# Patient Record
Sex: Female | Born: 1945 | Race: White | Hispanic: No | Smoking: Never smoker
Health system: Southern US, Community
[De-identification: ages and names within clinical notes are randomized; demographics above are authoritative.]

## PROBLEM LIST (undated history)

## (undated) DIAGNOSIS — T7840XA Allergy, unspecified, initial encounter: Secondary | ICD-10-CM

## (undated) DIAGNOSIS — I10 Essential (primary) hypertension: Secondary | ICD-10-CM

## (undated) DIAGNOSIS — D494 Neoplasm of unspecified behavior of bladder: Secondary | ICD-10-CM

## (undated) DIAGNOSIS — M797 Fibromyalgia: Secondary | ICD-10-CM

## (undated) DIAGNOSIS — C4490 Unspecified malignant neoplasm of skin, unspecified: Secondary | ICD-10-CM

## (undated) DIAGNOSIS — R112 Nausea with vomiting, unspecified: Secondary | ICD-10-CM

## (undated) DIAGNOSIS — N809 Endometriosis, unspecified: Secondary | ICD-10-CM

## (undated) DIAGNOSIS — Z9889 Other specified postprocedural states: Secondary | ICD-10-CM

## (undated) DIAGNOSIS — R251 Tremor, unspecified: Secondary | ICD-10-CM

## (undated) DIAGNOSIS — M1990 Unspecified osteoarthritis, unspecified site: Secondary | ICD-10-CM

## (undated) DIAGNOSIS — M810 Age-related osteoporosis without current pathological fracture: Secondary | ICD-10-CM

## (undated) HISTORY — DX: Fibromyalgia: M79.7

## (undated) HISTORY — DX: Unspecified malignant neoplasm of skin, unspecified: C44.90

## (undated) HISTORY — DX: Tremor, unspecified: R25.1

## (undated) HISTORY — DX: Essential (primary) hypertension: I10

## (undated) HISTORY — PX: STAPEDES SURGERY: SHX789

## (undated) HISTORY — DX: Allergy, unspecified, initial encounter: T78.40XA

## (undated) HISTORY — PX: APPENDECTOMY: SHX54

## (undated) HISTORY — DX: Neoplasm of unspecified behavior of bladder: D49.4

## (undated) HISTORY — DX: Endometriosis, unspecified: N80.9

## (undated) HISTORY — DX: Age-related osteoporosis without current pathological fracture: M81.0

---

## 1979-07-10 HISTORY — PX: TOTAL ABDOMINAL HYSTERECTOMY: SHX209

## 2000-09-19 ENCOUNTER — Encounter: Payer: Self-pay | Admitting: Obstetrics and Gynecology

## 2000-09-19 ENCOUNTER — Encounter: Admission: RE | Admit: 2000-09-19 | Discharge: 2000-09-19 | Payer: Self-pay | Admitting: Obstetrics and Gynecology

## 2002-06-04 ENCOUNTER — Encounter (INDEPENDENT_AMBULATORY_CARE_PROVIDER_SITE_OTHER): Payer: Self-pay | Admitting: Specialist

## 2002-06-04 ENCOUNTER — Ambulatory Visit (HOSPITAL_BASED_OUTPATIENT_CLINIC_OR_DEPARTMENT_OTHER): Admission: RE | Admit: 2002-06-04 | Discharge: 2002-06-05 | Payer: Self-pay | Admitting: Otolaryngology

## 2003-02-25 ENCOUNTER — Encounter: Payer: Self-pay | Admitting: Obstetrics and Gynecology

## 2003-02-25 ENCOUNTER — Encounter: Admission: RE | Admit: 2003-02-25 | Discharge: 2003-02-25 | Payer: Self-pay | Admitting: Obstetrics and Gynecology

## 2004-03-02 ENCOUNTER — Encounter: Admission: RE | Admit: 2004-03-02 | Discharge: 2004-03-02 | Payer: Self-pay | Admitting: Obstetrics and Gynecology

## 2005-03-09 ENCOUNTER — Encounter: Admission: RE | Admit: 2005-03-09 | Discharge: 2005-03-09 | Payer: Self-pay | Admitting: Obstetrics and Gynecology

## 2006-03-22 ENCOUNTER — Encounter: Admission: RE | Admit: 2006-03-22 | Discharge: 2006-03-22 | Payer: Self-pay | Admitting: Obstetrics and Gynecology

## 2006-05-30 ENCOUNTER — Encounter: Admission: RE | Admit: 2006-05-30 | Discharge: 2006-05-30 | Payer: Self-pay | Admitting: Obstetrics & Gynecology

## 2007-04-04 ENCOUNTER — Encounter: Admission: RE | Admit: 2007-04-04 | Discharge: 2007-04-04 | Payer: Self-pay | Admitting: Obstetrics & Gynecology

## 2007-09-12 ENCOUNTER — Ambulatory Visit: Payer: Self-pay | Admitting: Vascular Surgery

## 2007-09-28 ENCOUNTER — Ambulatory Visit: Payer: Self-pay | Admitting: Vascular Surgery

## 2008-04-08 ENCOUNTER — Encounter: Admission: RE | Admit: 2008-04-08 | Discharge: 2008-04-08 | Payer: Self-pay | Admitting: Internal Medicine

## 2008-04-23 ENCOUNTER — Encounter: Admission: RE | Admit: 2008-04-23 | Discharge: 2008-04-23 | Payer: Self-pay | Admitting: Internal Medicine

## 2008-04-29 ENCOUNTER — Other Ambulatory Visit: Admission: RE | Admit: 2008-04-29 | Discharge: 2008-04-29 | Payer: Self-pay | Admitting: Obstetrics and Gynecology

## 2009-03-24 ENCOUNTER — Encounter: Admission: RE | Admit: 2009-03-24 | Discharge: 2009-03-24 | Payer: Self-pay | Admitting: Obstetrics and Gynecology

## 2009-05-19 ENCOUNTER — Encounter: Admission: RE | Admit: 2009-05-19 | Discharge: 2009-05-19 | Payer: Self-pay | Admitting: Internal Medicine

## 2010-05-25 ENCOUNTER — Encounter: Admission: RE | Admit: 2010-05-25 | Discharge: 2010-05-25 | Payer: Self-pay | Admitting: Internal Medicine

## 2011-03-23 NOTE — Consult Note (Signed)
NEW PATIENT CONSULTATION   Natalie, Sutton  DOB:  [birth year]                                       09/12/2007  NUUVO#:[id number]   This patient is a healthy 65 year old female referred through the  courtesy of Mark A. Perini, M.D. for consultation regarding her venous  insufficiency of both lower extremities.  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  These do not cause pain either  aching, burning, cramping or throbbing.  She has no distal swelling,  edema or history of bleeding ulceration, thrombophlebitis, deep venous  thrombosis or pulmonary emboli.  She has not worn elastic compression  stockings and as noted is on her feet most of the day so she is unable  to elevate the legs.   PAST MEDICAL HISTORY:  Negative for diabetes, hypertension, coronary  artery disease, COPD or stroke.   PAST SURGICAL HISTORY:  Includes hysterectomy.   FAMILY HISTORY:  Positive for coronary artery disease and stroke in her  father who is 62 years old, but negative for diabetes.   SOCIAL HISTORY:  She is married, has one child, works as a  Associate Professor.  She does not use tobacco or alcohol.   REVIEW OF SYSTEMS AND MEDICATIONS:  Please see health history form.   ALLERGIES:  ERYTHROMYCIN.   PHYSICAL EXAMINATION:  Blood pressure is 140/80, heart rate is 70,  respirations are 14.  GENERAL:  She is a healthy-appearing middle-aged female.  She is alert  and oriented times three.  NECK:  Supple.  Three plus carotid pulses palpable.  No bruits are  audible.  There is no palpable adenopathy in the neck.  CHEST:  Clear to auscultation.  CARDIOVASCULAR EXAM:  Reveals a regular rhythm with no murmurs.  UPPER EXTREMITIES:  Pulses are intact.  No skin rashes are noted.  ABDOMEN:  Soft and nontender with no  masses.  She has 3+ femoral, popliteal and dorsalis pedis pulses bilaterally.  She has prominent spider and reticular veins in the left lateral thigh  distally at about the knee level and extending down into the very  proximal calf and posteriorly.  She has a few others on her anteromedial  thigh.  She also has a similar distribution on the right leg, but less  prominent.  There is no distal edema or evidence of any venous  hypertension such as hyperpigmentation, ulceration or significant  varicosities.   IMPRESSION:  Spider and reticular veins with no evidence of any  underlying greater saphenous incompetence or venous hypertension.   We will discuss with her possible sclerotherapy and skin laser  treatments for these spider and reticular veins.   Quita Skye Hart Rochester, M.D.  Electronically Signed   JDL/MEDQ  D:  09/12/2007  T:  09/13/2007  Job:  [id number]   cc:   Loraine Leriche A. Perini, M.D.

## 2011-03-26 NOTE — Op Note (Signed)
[name], [name]                         ACCOUNT NO.:  [id number]   MEDICAL RECORD NO.:  [id number]                    PATIENT TYPE:   LOCATION:                                       FACILITY:   PHYSICIAN:  Jefry H. Pollyann Kennedy, M.D.                DATE OF BIRTH:   DATE OF PROCEDURE:  06/04/2002  DATE OF DISCHARGE:                                 OPERATIVE REPORT   PREOPERATIVE DIAGNOSIS:  Bilateral conductive hearing loss, worse on the  left.   POSTOPERATIVE DIAGNOSES:  1. Bilateral conductive hearing loss.  2. Left otosclerosis.   PROCEDURE:  Left stapedectomy.   SURGEON:  Jefry H. Pollyann Kennedy, M.D.   ANESTHESIA:  General endotracheal anesthesia was used.   COMPLICATIONS:  No complications.   FINDINGS:  Clear and healthy middle ear with near complete fixation of the  stapes footplate.  Normal mobility of the malleus and incus.  No evidence of  ossicular degenerative disease.  No evidence of chronic otitis media.  Narrow external auditory canal that only permitted examination with a 5-mm  oral speculum.  The patient tolerated the procedure well, was awakened,  extubated and transferred to recovery in stable condition.   HISTORY:  A 65 year old lady with a history of progressive bilateral hearing  loss.  Office examination and audiometric evaluation revealed bilateral  conductive hearing loss, worse on the left, consistent with otosclerosis.  Risks, benefits, alternatives and complications of the procedure were  explained to the patient, who seemed to understand and agreed to surgery.   DESCRIPTION OF PROCEDURE:  The patient was taken to the operating room and  placed on the operating table in a supine position.  Following induction of  general endotracheal anesthesia, the left ear was prepped and draped in a  standard fashion.  Xylocaine 1% with epinephrine was infiltrated into the  four quadrants to the external auditory canal; a total of approximately 3 cc  were injected.   A posteriorly based tympanomeatal flap was developed and  brought forward, allowing access to the middle ear.  The drum and flap were  kept intact.  The chorda tympani nerve was hugging the superoposterior rim  of the annulus.  A small amount of bone was carefully curetted without  damaging or disrupting the chorda tympani nerve.  There was a small spicule  of bone attached from the promontory to the undersurface of the annulus  posteriorly: this was taken down to assist in visualization.  The ossicular  chain was visualized and palpated.  There was near complete fixation of the  stapes.  A trago-perichondral graft was harvested through a separate  incision.  This incision was closed with a running 5-0 plain gut suture.  The incudostapedial joint was separated with an IS knife.  The stapedial  tendon was severed using a small Beaver blade.  A control hole was created  in the  central portion of  the stapes footplate, which was relatively thin.  The superstructure was down-fractured against the promontory and was  separated from the remainder of the footplate.  Bits of footplate were  removed with an otologic pick and gimmick and some of the periphery were  simply pushed into the vestibule, as I was not able to remove them without  placing the contents of the vestibule in jeopardy.  The graft was placed  onto the oval window.  The anterior tympanic cavity was packed with saline-  soaked Gelfoam.  A Lippy modified Robinson bucket-handle prosthesis, 4 mm in  length, was inserted without difficulty and the bucket handle was flipped  over the incus long process.  There was a good round window reflex.  The  sizing was measured with an ossicular measuring device prior to selecting  the size.  There was good stability of the prosthesis with the graft in  place and nice round window reflex by palpating the incus and the malleus.  There was minimal bleeding encountered.  The flap was brought back to  its  original position and saline-soaked Gelfoam pieces were placed on top of the  canal incision.  A cotton ball with Bacitracin was placed into the external  meatus and the patient was then awakened, extubated and transferred to  recovery.                                               Jefry H. Pollyann Kennedy, M.D.    JHR/MEDQ  D:  06/04/2002  T:  06/08/2002  Job:  [id number]

## 2011-05-17 ENCOUNTER — Other Ambulatory Visit: Payer: Self-pay | Admitting: Internal Medicine

## 2011-05-17 DIAGNOSIS — Z1231 Encounter for screening mammogram for malignant neoplasm of breast: Secondary | ICD-10-CM

## 2011-06-07 ENCOUNTER — Ambulatory Visit
Admission: RE | Admit: 2011-06-07 | Discharge: 2011-06-07 | Disposition: A | Discharge status: Home / Self Care | Payer: Medicare Other | Source: Ambulatory Visit | Attending: Internal Medicine | Admitting: Internal Medicine

## 2011-06-07 DIAGNOSIS — Z1231 Encounter for screening mammogram for malignant neoplasm of breast: Secondary | ICD-10-CM

## 2012-06-13 ENCOUNTER — Other Ambulatory Visit: Payer: Self-pay | Admitting: Internal Medicine

## 2012-06-13 DIAGNOSIS — Z1231 Encounter for screening mammogram for malignant neoplasm of breast: Secondary | ICD-10-CM

## 2012-06-26 ENCOUNTER — Ambulatory Visit: Payer: Medicare Other

## 2012-07-03 ENCOUNTER — Ambulatory Visit
Admission: RE | Admit: 2012-07-03 | Discharge: 2012-07-03 | Disposition: A | Discharge status: Home / Self Care | Payer: Medicare Other | Source: Ambulatory Visit | Attending: Internal Medicine | Admitting: Internal Medicine

## 2012-07-03 DIAGNOSIS — Z1231 Encounter for screening mammogram for malignant neoplasm of breast: Secondary | ICD-10-CM

## 2012-07-06 ENCOUNTER — Other Ambulatory Visit: Payer: Self-pay | Admitting: Internal Medicine

## 2012-07-06 DIAGNOSIS — R928 Other abnormal and inconclusive findings on diagnostic imaging of breast: Secondary | ICD-10-CM

## 2012-07-07 ENCOUNTER — Ambulatory Visit
Admission: RE | Admit: 2012-07-07 | Discharge: 2012-07-07 | Disposition: A | Discharge status: Home / Self Care | Payer: Medicare Other | Source: Ambulatory Visit | Attending: Internal Medicine | Admitting: Internal Medicine

## 2012-07-07 DIAGNOSIS — R928 Other abnormal and inconclusive findings on diagnostic imaging of breast: Secondary | ICD-10-CM

## 2012-11-28 ENCOUNTER — Other Ambulatory Visit: Payer: Self-pay | Admitting: Family Medicine

## 2012-11-28 ENCOUNTER — Other Ambulatory Visit: Payer: Self-pay | Admitting: Internal Medicine

## 2012-11-28 ENCOUNTER — Other Ambulatory Visit: Payer: Self-pay | Admitting: Obstetrics & Gynecology

## 2012-11-28 DIAGNOSIS — R921 Mammographic calcification found on diagnostic imaging of breast: Secondary | ICD-10-CM

## 2012-12-27 ENCOUNTER — Other Ambulatory Visit: Payer: Self-pay | Admitting: Obstetrics & Gynecology

## 2012-12-27 ENCOUNTER — Ambulatory Visit
Admission: RE | Admit: 2012-12-27 | Discharge: 2012-12-27 | Disposition: A | Discharge status: Home / Self Care | Payer: Medicare Other | Source: Ambulatory Visit | Attending: Family Medicine | Admitting: Family Medicine

## 2012-12-27 DIAGNOSIS — R921 Mammographic calcification found on diagnostic imaging of breast: Secondary | ICD-10-CM

## 2013-02-27 ENCOUNTER — Encounter: Payer: Self-pay | Admitting: Internal Medicine

## 2013-04-11 ENCOUNTER — Encounter: Payer: Self-pay | Admitting: Internal Medicine

## 2013-04-11 ENCOUNTER — Ambulatory Visit (AMBULATORY_SURGERY_CENTER): Payer: Medicare Other

## 2013-04-11 VITALS — Ht 65.0 in | Wt 143.4 lb

## 2013-04-11 DIAGNOSIS — Z1211 Encounter for screening for malignant neoplasm of colon: Secondary | ICD-10-CM

## 2013-04-11 MED ORDER — MOVIPREP 100 G PO SOLR: ORAL | Status: DC

## 2013-04-11 NOTE — Progress Notes (Signed)
Pt came into the office for her pre-visit prior to her colonoscopy with Dr Juanda Chance on 04/25/13. Pt states she has a reaction to "egg whites"-causes swelling, but can eat eggs mixed in other foods or scrambled eggs.I left a message for Wilhemina Bonito. ( anesthetist) and did notify the manager of the patient's appointment.

## 2013-04-25 ENCOUNTER — Encounter: Payer: Self-pay | Admitting: Internal Medicine

## 2013-04-25 ENCOUNTER — Ambulatory Visit (AMBULATORY_SURGERY_CENTER): Payer: Medicare Other | Admitting: Internal Medicine

## 2013-04-25 VITALS — BP 110/70 | HR 56 | Temp 97.7°F | Resp 19 | Ht 65.0 in | Wt 143.0 lb

## 2013-04-25 DIAGNOSIS — Z1211 Encounter for screening for malignant neoplasm of colon: Secondary | ICD-10-CM

## 2013-04-25 MED ORDER — SODIUM CHLORIDE 0.9 % IV SOLN: 500.0000 mL | INTRAVENOUS | Status: DC

## 2013-04-25 NOTE — Patient Instructions (Signed)
YOU HAD AN ENDOSCOPIC PROCEDURE TODAY AT THE [facility]: Refer to the procedure report that was given to you for any specific questions about what was found during the examination.  If the procedure report does not answer your questions, please call your gastroenterologist to clarify.  If you requested that your care partner not be given the details of your procedure findings, then the procedure report has been included in a sealed envelope for you to review at your convenience later.  YOU SHOULD EXPECT: Some feelings of bloating in the abdomen. Passage of more gas than usual.  Walking can help get rid of the air that was put into your GI tract during the procedure and reduce the bloating. If you had a lower endoscopy (such as a colonoscopy or flexible sigmoidoscopy) you may notice spotting of blood in your stool or on the toilet paper. If you underwent a bowel prep for your procedure, then you may not have a normal bowel movement for a few days.  DIET: Your first meal following the procedure should be a light meal and then it is ok to progress to your normal diet.  A half-sandwich or bowl of soup is an example of a good first meal.  Heavy or fried foods are harder to digest and may make you feel nauseous or bloated.  Likewise meals heavy in dairy and vegetables can cause extra gas to form and this can also increase the bloating.  Drink plenty of fluids but you should avoid alcoholic beverages for 24 hours.  ACTIVITY: Your care partner should take you home directly after the procedure.  You should plan to take it easy, moving slowly for the rest of the day.  You can resume normal activity the day after the procedure however you should NOT DRIVE or use heavy machinery for 24 hours (because of the sedation medicines used during the test).    SYMPTOMS TO REPORT IMMEDIATELY: A gastroenterologist can be reached at any hour.  During normal business hours, 8:30 AM to 5:00 PM Monday through Friday,  call [phone].  After hours and on weekends, please call the GI answering service at [phone] who will take a message and have the physician on call contact you.   Following lower endoscopy (colonoscopy or flexible sigmoidoscopy):  Excessive amounts of blood in the stool  Significant tenderness or worsening of abdominal pains  Swelling of the abdomen that is new, acute  Fever of 100F or higher    FOLLOW UP: If any biopsies were taken you will be contacted by phone or by letter within the next 1-3 weeks.  Call your gastroenterologist if you have not heard about the biopsies in 3 weeks.  Our staff will call the home number listed on your records the next business day following your procedure to check on you and address any questions or concerns that you may have at that time regarding the information given to you following your procedure. This is a courtesy call and so if there is no answer at the home number and we have not heard from you through the emergency physician on call, we will assume that you have returned to your regular daily activities without incident.  SIGNATURES/CONFIDENTIALITY: You and/or your care partner have signed paperwork which will be entered into your electronic medical record.  These signatures attest to the fact that that the information above on your After Visit Summary has been reviewed and is understood.  Full responsibility of the confidentiality   of this discharge information lies with you and/or your care-partner.     

## 2013-04-25 NOTE — Progress Notes (Signed)
Pt stable to RR 

## 2013-04-25 NOTE — Op Note (Signed)
[facility] [address] [place], [address]   COLONOSCOPY PROCEDURE REPORT  PATIENT: [name], [name]  MR#: [id number] BIRTHDATE: [birth year] , 66  yrs. old GENDER: Female ENDOSCOPIST: Hart Carwin, MD REFERRED BY:  Rodrigo Ran, M.D. PROCEDURE DATE:  04/25/2013 PROCEDURE:   Colonoscopy, screening ASA CLASS:   Class II INDICATIONS:Average risk patient for colon cancer. MEDICATIONS: These medications were titrated to patient response per physician's verbal order, Fentanyl 100 mcg IV, and Versed 6 mg IV   DESCRIPTION OF PROCEDURE:   After the risks and benefits and of the procedure were explained, informed consent was obtained.  A digital rectal exam revealed no abnormalities of the rectum.    The LB [id number]  endoscope was introduced through the anus and advanced to the cecum, which was identified by both the appendix and ileocecal valve .  The quality of the prep was good, using MoviPrep .  The instrument was then slowly withdrawn as the colon was fully examined.     COLON FINDINGS: A normal appearing cecum, ileocecal valve, and appendiceal orifice were identified.  The ascending, hepatic flexure, transverse, splenic flexure, descending, sigmoid colon and rectum appeared unremarkable.  No polyps or cancers were seen. Retroflexed views revealed no abnormalities.     The scope was then withdrawn from the patient and the procedure completed.  COMPLICATIONS: There were no complications. ENDOSCOPIC IMPRESSION: Normal colon  RECOMMENDATIONS: High fiber diet   REPEAT EXAM: In 10 year(s)  for Colonoscopy.  cc:  _______________________________ eSignedHart Carwin, MD 04/25/2013 10:08 AM

## 2013-04-25 NOTE — Progress Notes (Signed)
Patient did not experience any of the following events: a burn prior to discharge; a fall within the facility; wrong site/side/patient/procedure/implant event; or a hospital transfer or hospital admission upon discharge from the facility. (G8907) Patient did not have preoperative order for IV antibiotic SSI prophylaxis. (G8918)  

## 2013-04-26 ENCOUNTER — Telehealth: Payer: Self-pay

## 2013-04-26 NOTE — Telephone Encounter (Signed)
  Follow up Call-  Call back number 04/25/2013  Post procedure Call Back phone  # [phone]  Permission to leave phone message Yes     Patient questions:  Do you have a fever, pain , or abdominal swelling? no Pain Score  0 *  Have you tolerated food without any problems? yes  Have you been able to return to your normal activities? yes  Do you have any questions about your discharge instructions: Diet   no Medications  no Follow up visit  no  Do you have questions or concerns about your Care? no  Actions: * If pain score is 4 or above: No action needed, pain <4.

## 2013-05-24 ENCOUNTER — Encounter: Payer: Self-pay | Admitting: Certified Nurse Midwife

## 2013-05-25 ENCOUNTER — Other Ambulatory Visit: Payer: Self-pay | Admitting: Internal Medicine

## 2013-05-25 ENCOUNTER — Other Ambulatory Visit: Payer: Self-pay | Admitting: Certified Nurse Midwife

## 2013-05-25 DIAGNOSIS — R921 Mammographic calcification found on diagnostic imaging of breast: Secondary | ICD-10-CM

## 2013-05-29 ENCOUNTER — Ambulatory Visit (INDEPENDENT_AMBULATORY_CARE_PROVIDER_SITE_OTHER): Payer: Medicare Other | Admitting: Certified Nurse Midwife

## 2013-05-29 ENCOUNTER — Encounter: Payer: Self-pay | Admitting: Certified Nurse Midwife

## 2013-05-29 VITALS — BP 128/64 | HR 64 | Ht 64.5 in | Wt 143.0 lb

## 2013-05-29 DIAGNOSIS — Z01419 Encounter for gynecological examination (general) (routine) without abnormal findings: Secondary | ICD-10-CM

## 2013-05-29 DIAGNOSIS — N951 Menopausal and female climacteric states: Secondary | ICD-10-CM

## 2013-05-29 MED ORDER — ESTRADIOL 0.025 MG/24HR TD PTTW: 1.0000 | MEDICATED_PATCH | TRANSDERMAL | Status: DC

## 2013-05-29 NOTE — Patient Instructions (Addendum)

## 2013-05-29 NOTE — Progress Notes (Signed)
Patient ID: [name], female   DOB: 03/29/1946, 67 y.o.   MRN: [id number] 67 y.o. G1P1001 MarriedCaucasianF here for annual exam. Menopausal no vaginal bleeding or vaginal dryness. Feels ERT helps with overall well being. See PCP for aex, labs and medication management. Finally had colonoscopy in June 2014 all negative! Tremors have maintained the same, no change. No health issues today.    No LMP recorded. Patient is postmenopausal.          Sexually active: yes  The current method of family planning is status post hysterectomy.    Exercising: yes  treadmill Smoker:  no  Health Maintenance: Pap:  2010, norml History of abnormal Pap:  no MMG:  12/2012, repeat in 06/2013 under follow up with right breast calcifications, has appointment scheduled Colonoscopy:  04/2013 BMD:   2013 osteopenia PCP manages TDaP:  Will get at [facility] Screening Labs: PCP, Hb today: PCP, Urine today: PCP   reports that she has never smoked. She has never used smokeless tobacco. She reports that she does not drink alcohol or use illicit drugs.  Past Medical History  Diagnosis Date  . 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 Allergy   . Fibromyalgia   . Endometriosis     Past Surgical History  Procedure Laterality Date  . Stapedes surgery      left ear  . Appendectomy    . Total abdominal hysterectomy  1980's  . Appendectomy      Current Outpatient Prescriptions  Medication Sig Dispense Refill  . CALCIUM PO Take by mouth daily.      . diclofenac (CATAFLAM) 50 MG tablet Take 50 mg by mouth daily.      Marland Kitchen estradiol (VIVELLE-DOT) 0.025 MG/24HR Place 1 patch onto the skin 2 (two) times a week.      . gabapentin (NEURONTIN) 300 MG capsule Take 300 mg by mouth 3 (three) times daily.      . mometasone (NASONEX) 50 MCG/ACT nasal spray Place 2 sprays into the nose as needed.      . Multiple Vitamin (MULTIVITAMIN) tablet Take 1 tablet by mouth daily.      . Omega-3 Fatty Acids (FISH OIL) 500 MG CAPS  Take by mouth.      . Vitamin D, Ergocalciferol, (DRISDOL) 50000 UNITS CAPS Take 50,000 Units by mouth every 14 (fourteen) days.        No current facility-administered medications for this visit.    Family History  Problem Relation Age of Onset  . Heart disease Father   . Stroke Father   . Hypertension Father   . Alzheimer's disease Mother   . Osteoporosis Mother     ROS:  Pertinent items are noted in HPI.  Otherwise, a comprehensive ROS was negative.  Exam:   BP 128/64  Pulse 64  Ht 5' 4.5" (1.638 m)  Wt 143 lb (64.864 kg)  BMI 24.18 kg/m2 Height: 5' 4.5" (163.8 cm)  Ht Readings from Last 3 Encounters:  05/29/13 5' 4.5" (1.638 m)  04/25/13 5\' 5"  (1.651 m)  04/11/13 5\' 5"  (1.651 m)    General appearance: alert, cooperative and appears stated age Head: Normocephalic, without obvious abnormality, atraumatic Neck: no adenopathy, supple, symmetrical, trachea midline and thyroid normal to inspection and palpation Lungs: clear to auscultation bilaterally Breasts: normal appearance, no masses or tenderness, No nipple retraction or dimpling, No nipple discharge or bleeding, No axillary or supraclavicular adenopathy Heart: regular rate and rhythm Abdomen: soft, non-tender;  bowel sounds normal; no masses,  no organomegaly Extremities: extremities normal, atraumatic, no cyanosis or edema Skin: Skin color, texture, turgor normal. No rashes or lesions Lymph nodes: Cervical, supraclavicular, and axillary nodes normal. No abnormal inguinal nodes palpated Neurologic: Grossly normal   Pelvic: External genitalia:  no lesions              Urethra:  normal appearing urethra with no masses, tenderness or lesions              Bartholins and Skenes: normal                 Vagina: normal appearing vagina with normal color and discharge, no lesions              Cervix: absent              Pap taken: no Bimanual Exam:  Uterus:  uterus absent              Adnexa: normal adnexa and no mass,  fullness, tenderness               Rectovaginal: Confirms               Anus:  normal sphincter tone, no lesions  A:  Well Woman with normal exam  Menopausal on ERT desires continuance  History of Tremors/Fibromyalgia with PCP management  Recent colonoscopy negative  Osteopenia PCP management  P:   Reviewed health and wellness pertinent to exam  Discussed risks and benefits and long term information available. Patient aware of dementia information, but provider and patient feel a continued good choice with Osteopenia history also.  Rx Vivelle dot  See order  Mammogram yearly and keep follow up appointment pap smear not taken today  counseled on breast self exam, mammography screening, use and side effects of HRT, adequate intake of calcium and vitamin D, diet and exercise  return annually or prn  An After Visit Summary was printed and given to the patient.

## 2013-06-01 NOTE — Progress Notes (Signed)
Note reviewed, agree with plan.  Lesly Joslyn, MD  

## 2013-08-17 ENCOUNTER — Ambulatory Visit
Admission: RE | Admit: 2013-08-17 | Discharge: 2013-08-17 | Disposition: A | Discharge status: Home / Self Care | Payer: Medicare Other | Source: Ambulatory Visit | Attending: Certified Nurse Midwife | Admitting: Certified Nurse Midwife

## 2013-08-17 DIAGNOSIS — R921 Mammographic calcification found on diagnostic imaging of breast: Secondary | ICD-10-CM

## 2013-08-20 ENCOUNTER — Encounter (HOSPITAL_BASED_OUTPATIENT_CLINIC_OR_DEPARTMENT_OTHER): Payer: Self-pay | Admitting: Emergency Medicine

## 2013-08-20 ENCOUNTER — Emergency Department (HOSPITAL_BASED_OUTPATIENT_CLINIC_OR_DEPARTMENT_OTHER)
Admission: EM | Admit: 2013-08-20 | Discharge: 2013-08-21 | Disposition: A | Discharge status: Home / Self Care | Payer: Medicare Other | Attending: Emergency Medicine | Admitting: Emergency Medicine

## 2013-08-20 ENCOUNTER — Other Ambulatory Visit: Payer: Self-pay

## 2013-08-20 DIAGNOSIS — Z8742 Personal history of other diseases of the female genital tract: Secondary | ICD-10-CM | POA: Insufficient documentation

## 2013-08-20 DIAGNOSIS — Z79899 Other long term (current) drug therapy: Secondary | ICD-10-CM | POA: Insufficient documentation

## 2013-08-20 DIAGNOSIS — Z880 Allergy status to penicillin: Secondary | ICD-10-CM | POA: Insufficient documentation

## 2013-08-20 DIAGNOSIS — H109 Unspecified conjunctivitis: Secondary | ICD-10-CM

## 2013-08-20 DIAGNOSIS — Z8739 Personal history of other diseases of the musculoskeletal system and connective tissue: Secondary | ICD-10-CM | POA: Insufficient documentation

## 2013-08-20 MED ORDER — TETRACAINE HCL 0.5 % OP SOLN: 2.0000 [drp] | Freq: Once | OPHTHALMIC | Status: DC

## 2013-08-20 MED ORDER — FLUORESCEIN SODIUM 1 MG OP STRP: 1.0000 | ORAL_STRIP | Freq: Once | OPHTHALMIC | Status: DC

## 2013-08-20 MED ORDER — FLUORESCEIN SODIUM 1 MG OP STRP
ORAL_STRIP | OPHTHALMIC | Status: AC
  Filled 2013-08-20: qty 1

## 2013-08-20 MED ORDER — CIPROFLOXACIN HCL 0.3 % OP SOLN
2.0000 [drp] | OPHTHALMIC | Status: DC
  Filled 2013-08-20: qty 2.5

## 2013-08-20 MED ORDER — CIPROFLOXACIN HCL 0.3 % OP SOLN
2.0000 [drp] | OPHTHALMIC | Status: DC
  Administered 2013-08-21: 2 [drp] via OPHTHALMIC

## 2013-08-20 MED ORDER — TETRACAINE HCL 0.5 % OP SOLN
OPHTHALMIC | Status: AC
  Filled 2013-08-20: qty 2

## 2013-08-20 NOTE — ED Notes (Signed)
Pt took benadryl PTA

## 2013-08-20 NOTE — ED Provider Notes (Addendum)
CSN: [id number]     Arrival date & time 08/20/13  2323 History  This chart was scribed for Hanley Seamen, MD by Blanchard Kelch, ED Scribe. The patient was seen in room MH04/MH04. Patient's care was started at 11:49 PM.    Chief Complaint  Patient presents with  . Eye Problem    Patient is a 67 y.o. female presenting with eye problem. The history is provided by the patient. No language interpreter was used.  Eye Problem   HPI Comments: [name] is a 67 y.o. female who presents to the Emergency Department complaining of constant, worsening right eye pain and swelling that began yesterday afternoon when she felt there was a foreign body in her eye. She flushed it out with water without relief. She reports the pain is a 5/10 in severity. She denies nausea, vomiting, diarrhea, cough or sore throat. She is allergic to Amoxicillin. There is associated mucoid discharge. There is associated periorbital erythema and "puffiness".   Past Medical History  Diagnosis Date  . 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 Allergy   . Fibromyalgia   . Endometriosis    Past Surgical History  Procedure Laterality Date  . Stapedes surgery      left ear  . Appendectomy    . Total abdominal hysterectomy  1980's  . Appendectomy     Family History  Problem Relation Age of Onset  . Heart disease Father   . Stroke Father   . Hypertension Father   . Alzheimer's disease Mother   . Osteoporosis Mother    History  Substance Use Topics  . Smoking status: Never Smoker   . Smokeless tobacco: Never Used  . Alcohol Use: No   OB History   Grav Para Term Preterm Abortions TAB SAB Ect Mult Living   1 1 1       1      Review of Systems A complete 10 system review of systems was obtained and all systems are negative except as noted in the HPI and PMH.    Allergies  Amoxicillin and Eggs or egg-derived products  Home Medications   Current Outpatient Rx  Name  Route  Sig  Dispense  Refill  .  CALCIUM PO   Oral   Take by mouth daily.         . diclofenac (CATAFLAM) 50 MG tablet   Oral   Take 50 mg by mouth daily.         Marland Kitchen estradiol (VIVELLE-DOT) 0.025 MG/24HR   Transdermal   Place 1 patch onto the skin 2 (two) times a week.   8 patch   12   . gabapentin (NEURONTIN) 300 MG capsule   Oral   Take 300 mg by mouth 3 (three) times daily.         . mometasone (NASONEX) 50 MCG/ACT nasal spray   Nasal   Place 2 sprays into the nose as needed.         . Multiple Vitamin (MULTIVITAMIN) tablet   Oral   Take 1 tablet by mouth daily.         . Omega-3 Fatty Acids (FISH OIL) 500 MG CAPS   Oral   Take by mouth.         . Vitamin D, Ergocalciferol, (DRISDOL) 50000 UNITS CAPS   Oral   Take 50,000 Units by mouth every 14 (fourteen) days.  Triage Vitals: BP 152/66  Pulse 64  Temp(Src) 98.6 F (37 C) (Oral)  Resp 16  SpO2 100%  Physical Exam  Nursing note and vitals reviewed. General: Well-developed, well-nourished female in no acute distress; appearance consistent with age of record HENT: normocephalic; atraumatic Eyes: pupils equal, round and reactive to light; extraocular muscles intact; conjunctiva injected on right; mild mucoidal discharge on right; right periorbital edema; No fluorescein uptake in right eye; No foreign object in right eye; No soft tissue tenderness periorbitally on right Neck: supple Heart: regular rate and rhythm; no murmurs, rubs or gallops Lungs: clear to auscultation bilaterally Abdomen: soft; nondistended; nontender; no masses or hepatosplenomegaly; bowel sounds present Extremities: No deformity; full range of motion; pulses normal Neurologic: Awake, alert and oriented; motor function intact in all extremities and symmetric; no facial droop; resting tremor primarily of head and neck. Skin: Warm and dry Psychiatric: Normal mood and affect   ED Course  Procedures (including critical care time)  DIAGNOSTIC  STUDIES: Oxygen Saturation is 100% on room air, normal by my interpretation.    COORDINATION OF CARE: 11:42 PM -Clinical suspicion of conjunctivitis. Patient verbalizes understanding and agrees with treatment plan.   MDM   1. Conjunctivitis of right eye    I personally performed the services described in this documentation, which was scribed in my presence.  The recorded information has been reviewed and is accurate.    Hanley Seamen, MD 08/20/13 2350  Hanley Seamen, MD 08/20/13 2350

## 2013-08-20 NOTE — ED Notes (Signed)
Pt c/o redness and drainage from right eye since yesterday, denies injury

## 2013-08-29 ENCOUNTER — Other Ambulatory Visit: Payer: Self-pay | Admitting: Certified Nurse Midwife

## 2013-08-29 NOTE — Telephone Encounter (Signed)
eScribe request for refill on Vitamin D 29562 Last filled - 05/22/12, #26 x 1 RF Last AEX - 05/29/13 Next AEX - 06/04/14 Last Vitamin D check = 05/22/12 = 4 Please advise refills.

## 2013-08-30 NOTE — Telephone Encounter (Signed)
She thought PCP had checked because they do her labs, and I did not refill without recheck. Patient needs to check with PCP for level

## 2013-08-30 NOTE — Telephone Encounter (Signed)
pt states the pharmacy has been calling to get refills for Vit D pt would like to talk to the nurse concerning this

## 2013-08-30 NOTE — Telephone Encounter (Signed)
Patient had AEX 7/14. We did not check her vitamin d level at that time. Last checked in 7/13 and level was 47. Chart on your desk. Please advise.

## 2013-08-31 ENCOUNTER — Telehealth: Payer: Self-pay | Admitting: Certified Nurse Midwife

## 2013-08-31 NOTE — Telephone Encounter (Signed)
Patient was seen for AEX 05/29/13 no rx was given nor was a level checked. Patient says she generally takes her Vitamin D 50,000 on Saturday q other week. Told patient that you were not in the office today and you wouldn't be back till Tuesday, and that we couldn't refill it without her having her level checked. Recommended that she can take 1,000 iu's of Vit D to hold her over and that someone will get in touch with her next week when you were back in the office.  Patient is agreeable, please advise.

## 2013-08-31 NOTE — Telephone Encounter (Signed)
Chief Complaint  Patient presents with   Medication Refill  pt calling regarding her vit d refill. Was told she need an appointment but just came in august.

## 2013-09-04 NOTE — Telephone Encounter (Signed)
Chart is in your door 

## 2013-09-04 NOTE — Telephone Encounter (Signed)
Need chart

## 2013-09-05 NOTE — Telephone Encounter (Signed)
Due to last 2 years level being good can do Vit D 3 OTC 1000 IU daily Will recheck at next annual

## 2013-09-05 NOTE — Telephone Encounter (Signed)
Left message to call back  

## 2013-09-05 NOTE — Telephone Encounter (Signed)
Notified patient to take otc Vit D 1,000 iu's daily and you will recheck it again at her next annual exam.

## 2013-09-06 ENCOUNTER — Telehealth: Payer: Self-pay | Admitting: Certified Nurse Midwife

## 2013-09-06 NOTE — Telephone Encounter (Signed)
Patient wants joy to call her back called during lunch 09/05/13

## 2013-09-07 NOTE — Telephone Encounter (Signed)
Pt spoke with jasmine regarding vitamin d

## 2014-06-04 ENCOUNTER — Ambulatory Visit (INDEPENDENT_AMBULATORY_CARE_PROVIDER_SITE_OTHER): Payer: Medicare Other | Admitting: Certified Nurse Midwife

## 2014-06-04 ENCOUNTER — Encounter: Payer: Self-pay | Admitting: Certified Nurse Midwife

## 2014-06-04 VITALS — BP 118/72 | HR 68 | Resp 16 | Ht 64.5 in | Wt 139.0 lb

## 2014-06-04 DIAGNOSIS — E559 Vitamin D deficiency, unspecified: Secondary | ICD-10-CM

## 2014-06-04 DIAGNOSIS — Z01419 Encounter for gynecological examination (general) (routine) without abnormal findings: Secondary | ICD-10-CM

## 2014-06-04 DIAGNOSIS — M810 Age-related osteoporosis without current pathological fracture: Secondary | ICD-10-CM

## 2014-06-04 DIAGNOSIS — N951 Menopausal and female climacteric states: Secondary | ICD-10-CM

## 2014-06-04 HISTORY — DX: Age-related osteoporosis without current pathological fracture: M81.0

## 2014-06-04 MED ORDER — ESTRADIOL 0.025 MG/24HR TD PTTW: 1.0000 | MEDICATED_PATCH | TRANSDERMAL | Status: DC

## 2014-06-04 NOTE — Progress Notes (Signed)
68 y.o. G39P1001 Married Caucasian Fe here for annual exam. Menopausal on ERT, desires continuance. Recently diagnosed with Osteoporosis and now is on Boniva with PCP management who also manages tremors with Neurontin. Denies vaginal bleeding or vaginal dryness. No health issues.   No LMP recorded. Patient has had a hysterectomy.          Sexually active: Yes.    The current method of family planning is status post hysterectomy.    Exercising: Yes.    walking Smoker:  no  Health Maintenance: Pap: 05-08-09 neg MMG: 08-17-13 breast density category c, birads category 2:neg Colonoscopy:  6/14 BMD:   2013 TDaP:  2015 Labs: none Self breast exam: done monthly   reports that she has never smoked. She has never used smokeless tobacco. She reports that she does not drink alcohol or use illicit drugs.  Past Medical History  Diagnosis Date  . 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 Allergy   . Fibromyalgia   . Endometriosis   . Osteoporosis     Past Surgical History  Procedure Laterality Date  . Stapedes surgery      left ear  . Appendectomy    . Total abdominal hysterectomy  1980's  . Appendectomy      Current Outpatient Prescriptions  Medication Sig Dispense Refill  . CALCIUM PO Take by mouth daily. + d      . Cholecalciferol (VITAMIN D PO) Take by mouth daily.      . diclofenac (VOLTAREN) 50 MG EC tablet daily.      Marland Kitchen estradiol (VIVELLE-DOT) 0.025 MG/24HR Place 1 patch onto the skin 2 (two) times a week.  8 patch  12  . gabapentin (NEURONTIN) 300 MG capsule Take 300 mg by mouth 3 (three) times daily.      Marland Kitchen ibandronate (BONIVA) 150 MG tablet Take 150 mg by mouth every 30 (thirty) days. Take in the morning with a full glass of water, on an empty stomach, and do not take anything else by mouth or lie down for the next 30 min.      . mometasone (NASONEX) 50 MCG/ACT nasal spray Place 2 sprays into the nose as needed.      . Multiple Vitamin (MULTIVITAMIN) tablet Take 1 tablet by  mouth daily.      . Omega-3 Fatty Acids (FISH OIL) 500 MG CAPS Take by mouth.       No current facility-administered medications for this visit.    Family History  Problem Relation Age of Onset  . Heart disease Father   . Stroke Father   . Hypertension Father   . Alzheimer's disease Mother   . Osteoporosis Mother     ROS:  Pertinent items are noted in HPI.  Otherwise, a comprehensive ROS was negative.  Exam:   BP 118/72  Pulse 68  Resp 16  Ht 5' 4.5" (1.638 m)  Wt 139 lb (63.05 kg)  BMI 23.50 kg/m2 Height: 5' 4.5" (163.8 cm)  Ht Readings from Last 3 Encounters:  06/04/14 5' 4.5" (1.638 m)  05/29/13 5' 4.5" (1.638 m)  04/25/13 5\' 5"  (1.651 m)    General appearance: alert, cooperative and appears stated age Head: Normocephalic, without obvious abnormality, atraumatic Neck: no adenopathy, supple, symmetrical, trachea midline and thyroid normal to inspection and palpation and non-palpable Lungs: clear to auscultation bilaterally Breasts: normal appearance, no masses or tenderness, No nipple retraction or dimpling, No nipple discharge or bleeding, No axillary or supraclavicular  adenopathy Heart: regular rate and rhythm Abdomen: soft, non-tender; no masses,  no organomegaly Extremities: extremities normal, atraumatic, no cyanosis or edema Skin: Skin color, texture, turgor normal. No rashes or lesions Lymph nodes: Cervical, supraclavicular, and axillary nodes normal. No abnormal inguinal nodes palpated Neurologic: Grossly normal   Pelvic: External genitalia:  no lesions              Urethra:  normal appearing urethra with no masses, tenderness or lesions              Bartholin's and Skene's: normal                 Vagina: normal appearing vagina with normal color and discharge, no lesions              Cervix: absent              Pap taken: No. Bimanual Exam:  Uterus:  uterus absent              Adnexa: normal adnexa and no mass, fullness, tenderness                Rectovaginal: Confirms               Anus:  normal sphincter tone, no lesions  A:  Well Woman with normal exam  Menopausal on ERT desires continuance  Tremors on stable medication with PCP management  Osteoporosis on Boniva now with PCP management  P:   Reviewed health and wellness pertinent to exam  Discussed risks and benefits of ERT and with osteoporosis diagnosis now also. Patient and provider agree with no other health issues continuing acceptable and desired.  Rx Vivelle dot see order  Continue follow up as indicated  Pap smear not taken today   counseled on breast self exam, mammography screening, osteoporosis, adequate intake of calcium and vitamin D, diet and exercise  return annually or prn  An After Visit Summary was printed and given to the patient.

## 2014-06-04 NOTE — Progress Notes (Signed)
Reviewed personally.  M. Suzanne Atzel Mccambridge, MD.  

## 2014-06-04 NOTE — Patient Instructions (Signed)

## 2014-06-05 LAB — VITAMIN D 25 HYDROXY (VIT D DEFICIENCY, FRACTURES): Vit D, 25-Hydroxy: 50 ng/mL (ref 30–89)

## 2014-07-26 ENCOUNTER — Other Ambulatory Visit: Payer: Self-pay

## 2014-07-26 DIAGNOSIS — Z1231 Encounter for screening mammogram for malignant neoplasm of breast: Secondary | ICD-10-CM

## 2014-08-20 ENCOUNTER — Ambulatory Visit
Admission: RE | Admit: 2014-08-20 | Discharge: 2014-08-20 | Disposition: A | Discharge status: Home / Self Care | Payer: Medicare Other | Source: Ambulatory Visit

## 2014-08-20 ENCOUNTER — Encounter (INDEPENDENT_AMBULATORY_CARE_PROVIDER_SITE_OTHER): Payer: Self-pay

## 2014-08-20 DIAGNOSIS — Z1231 Encounter for screening mammogram for malignant neoplasm of breast: Secondary | ICD-10-CM

## 2014-09-09 ENCOUNTER — Encounter: Payer: Self-pay | Admitting: Certified Nurse Midwife

## 2014-12-17 ENCOUNTER — Other Ambulatory Visit: Payer: Self-pay | Admitting: Dermatology

## 2015-06-11 ENCOUNTER — Encounter: Payer: Self-pay | Admitting: Certified Nurse Midwife

## 2015-06-11 ENCOUNTER — Ambulatory Visit (INDEPENDENT_AMBULATORY_CARE_PROVIDER_SITE_OTHER): Payer: Medicare Other | Admitting: Certified Nurse Midwife

## 2015-06-11 VITALS — BP 120/80 | HR 72 | Resp 20 | Ht 64.0 in | Wt 145.0 lb

## 2015-06-11 DIAGNOSIS — Z01419 Encounter for gynecological examination (general) (routine) without abnormal findings: Secondary | ICD-10-CM | POA: Diagnosis not present

## 2015-06-11 DIAGNOSIS — N952 Postmenopausal atrophic vaginitis: Secondary | ICD-10-CM | POA: Diagnosis not present

## 2015-06-11 MED ORDER — ESTRADIOL 10 MCG VA TABS: ORAL_TABLET | VAGINAL | Status: DC

## 2015-06-11 NOTE — Progress Notes (Signed)
Reviewed personally.  M. Suzanne Shawnda Mauney, MD.  

## 2015-06-11 NOTE — Patient Instructions (Addendum)
EXERCISE AND DIET:  We recommended that you start or continue a regular exercise program for good health. Regular exercise means any activity that makes your heart beat faster and makes you sweat.  We recommend exercising at least 30 minutes per day at least 3 days a week, preferably 4 or 5.  We also recommend a diet low in fat and sugar.  Inactivity, poor dietary choices and obesity can cause diabetes, heart attack, stroke, and kidney damage, among others.    ALCOHOL AND SMOKING:  Women should limit their alcohol intake to no more than 7 drinks/beers/glasses of wine (combined, not each!) per week. Moderation of alcohol intake to this level decreases your risk of breast cancer and liver damage. And of course, no recreational drugs are part of a healthy lifestyle.  And absolutely no smoking or even second hand smoke. Most people know smoking can cause heart and lung diseases, but did you know it also contributes to weakening of your bones? Aging of your skin?  Yellowing of your teeth and nails?  CALCIUM AND VITAMIN D:  Adequate intake of calcium and Vitamin D are recommended.  The recommendations for exact amounts of these supplements seem to change often, but generally speaking 600 mg of calcium (either carbonate or citrate) and 800 units of Vitamin D per day seems prudent. Certain women may benefit from higher intake of Vitamin D.  If you are among these women, your doctor will have told you during your visit.    PAP SMEARS:  Pap smears, to check for cervical cancer or precancers,  have traditionally been done yearly, although recent scientific advances have shown that most women can have pap smears less often.  However, every woman still should have a physical exam from her gynecologist every year. It will include a breast check, inspection of the vulva and vagina to check for abnormal growths or skin changes, a visual exam of the cervix, and then an exam to evaluate the size and shape of the uterus and  ovaries.  And after 70 years of age, a rectal exam is indicated to check for rectal cancers. We will also provide age appropriate advice regarding health maintenance, like when you should have certain vaccines, screening for sexually transmitted diseases, bone density testing, colonoscopy, mammograms, etc.   MAMMOGRAMS:  All women over 40 years old should have a yearly mammogram. Many facilities now offer a "3D" mammogram, which may cost around $50 extra out of pocket. If possible,  we recommend you accept the option to have the 3D mammogram performed.  It both reduces the number of women who will be called back for extra views which then turn out to be normal, and it is better than the routine mammogram at detecting truly abnormal areas.    COLONOSCOPY:  Colonoscopy to screen for colon cancer is recommended for all women at age 50.  We know, you hate the idea of the prep.  We agree, BUT, having colon cancer and not knowing it is worse!!  Colon cancer so often starts as a polyp that can be seen and removed at colonscopy, which can quite literally save your life!  And if your first colonoscopy is normal and you have no family history of colon cancer, most women don't have to have it again for 10 years.  Once every ten years, you can do something that may end up saving your life, right?  We will be happy to help you get it scheduled when you are ready.    Be sure to check your insurance coverage so you understand how much it will cost.  It may be covered as a preventative service at no cost, but you should check your particular policy.      Atrophic Vaginitis Atrophic vaginitis is a problem of low levels of estrogen in women. This problem can happen at any age. It is most common in women who have gone through menopause ("the change").  HOW WILL I KNOW IF I HAVE THIS PROBLEM? You may have:  Trouble with peeing (urinating), such as:  Going to the bathroom often.  A hard time holding your pee until you  reach a bathroom.  Leaking pee.  Having pain when you pee.  Itching or a burning feeling.  Vaginal bleeding and spotting.  Pain during sex.  Dryness of the vagina.  A yellow, bad-smelling fluid (discharge) coming from the vagina. HOW WILL MY DOCTOR CHECK FOR THIS PROBLEM?  During your exam, your doctor will likely find the problem.  If there is a vaginal fluid, it may be checked for infection. HOW WILL THIS PROBLEM BE TREATED? Keep the vulvar skin as clean as possible. Moisturizers and lubricants can help with some of the symptoms. Estrogen replacement can help. There are 2 ways to take estrogen:  Systemic estrogen gets estrogen to your whole body. It takes many weeks or months before the symptoms get better.  You take an estrogen pill.  You use a skin patch. This is a patch that you put on your skin.  If you still have your uterus, your doctor may ask you to take a hormone. Talk to your doctor about the right medicine for you.  Estrogen cream.  This puts estrogen only at the part of your body where you apply it. The cream is put into the vagina or put on the vulvar skin. For some women, estrogen cream works faster than pills or the patch. CAN ALL WOMEN WITH THIS PROBLEM USE ESTROGEN? No. Women with certain types of cancer, liver problems, or problems with blood clots should not take estrogen. Your doctor can help you decide the best treatment for your symptoms. Document Released: 04/12/2008 Document Revised: 10/30/2013 Document Reviewed: 04/12/2008 [facility] Patient Information 2015 [place], [place]. This information is not intended to replace advice given to you by your health care provider. Make sure you discuss any questions you have with your health care provider.

## 2015-06-11 NOTE — Progress Notes (Signed)
69 y.o. G53P1001 Married  Caucasian Fe here for annual exam. Denies vaginal dryness or vaginal bleeding. Complaining of frequent urination, more so with increase fluids. Does not plan for where bathrooms are, so does not feel it is a problem. Denies leaking of urine or burning or pain with urination.. Sees Dr, Perrini yearly for aex/labs.Neurotin working well for tremors, no increase! PCP manages bone density and Boniva.  No LMP recorded. Patient has had a hysterectomy.          Sexually active: Yes.    The current method of family planning is status post hysterectomy.    Exercising: Yes.    walking Smoker:  no  Health Maintenance: Pap: 05-08-09 neg MMG: 08-20-14 category c density,birads 1:neg Colonoscopy:  6/14 negative 10 year BMD:   2013 TDaP:  2015 Labs: pcp Self breast exam: done monthly   reports that she has never smoked. She has never used smokeless tobacco. She reports that she does not drink alcohol or use illicit drugs.  Past Medical History  Diagnosis Date  . 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 Allergy   . Fibromyalgia   . Endometriosis   . Osteoporosis   . Osteoporosis, unspecified 06/04/2014    PCP management with medication    Past Surgical History  Procedure Laterality Date  . Stapedes surgery      left ear  . Appendectomy    . Total abdominal hysterectomy  1980's  . Appendectomy      Current Outpatient Prescriptions  Medication Sig Dispense Refill  . CALCIUM PO Take by mouth daily. + d    . Cholecalciferol (VITAMIN D PO) Take by mouth daily.    Marland Kitchen CINNAMON PO Take by mouth as needed.    . diclofenac (VOLTAREN) 50 MG EC tablet daily.    Marland Kitchen estradiol (VIVELLE-DOT) 0.025 MG/24HR Place 1 patch onto the skin 2 (two) times a week. 8 patch 12  . gabapentin (NEURONTIN) 300 MG capsule Take 300 mg by mouth 3 (three) times daily.    Marland Kitchen ibandronate (BONIVA) 150 MG tablet Take 150 mg by mouth every 30 (thirty) days. Take in the morning with a full glass of water,  on an empty stomach, and do not take anything else by mouth or lie down for the next 30 min.    . mometasone (NASONEX) 50 MCG/ACT nasal spray Place 2 sprays into the nose as needed.    . Multiple Vitamin (MULTIVITAMIN) tablet Take 1 tablet by mouth daily.    . Omega-3 Fatty Acids (FISH OIL) 500 MG CAPS Take by mouth.     No current facility-administered medications for this visit.    Family History  Problem Relation Age of Onset  . Heart disease Father   . Stroke Father   . Hypertension Father   . Alzheimer's disease Mother   . Osteoporosis Mother     ROS:  Pertinent items are noted in HPI.  Otherwise, a comprehensive ROS was negative.  Exam:   Ht 5\' 4"  (1.626 m)  Wt 145 lb (65.772 kg)  BMI 24.88 kg/m2 Height: 5\' 4"  (162.6 cm) Ht Readings from Last 3 Encounters:  06/11/15 5\' 4"  (1.626 m)  06/04/14 5' 4.5" (1.638 m)  05/29/13 5' 4.5" (1.638 m)    General appearance: alert, cooperative and appears stated age Head: Normocephalic, without obvious abnormality, atraumatic Neck: no adenopathy, supple, symmetrical, trachea midline and thyroid normal to inspection and palpation Lungs: clear to auscultation bilaterally Breasts: normal  appearance, no masses or tenderness, No nipple retraction or dimpling, No nipple discharge or bleeding, No axillary or supraclavicular adenopathy Heart: regular rate and rhythm Abdomen: soft, non-tender; no masses,  no organomegaly Extremities: extremities normal, atraumatic, no cyanosis or edema Skin: Skin color, texture, turgor normal. No rashes or lesions Lymph nodes: Cervical, supraclavicular, and axillary nodes normal. No abnormal inguinal nodes palpated Neurologic: Grossly normal   Pelvic: External genitalia:  no lesions              Urethra:  normal appearing urethra with no masses, tenderness or lesions              Bartholin's and Skene's: normal                 Vagina: normal appearing vagina with normal color and discharge, no lesions               Cervix: absent              Pap taken: No. Bimanual Exam:  Uterus:  uterus absent              Adnexa: normal adnexa and no mass, fullness, tenderness               Rectovaginal: Confirms               Anus:  normal sphincter tone, no lesions  Chaperone present: not taken  A:  Well Woman with normal exam  Menopausal on ERT  Atrophic vaginitis  Tremors with neurology management  Osteoporosis on Boniva with  PCP management  Urinary frequency no change ? Related to atrophic vaginitis  P:   Reviewed health and wellness pertinent to exam  Discussed now that she has used the lowest vivelle dot Rx that she should stop ERT. Patient agreeable, did not notice any change with decrease dosage. Aware she may with no use, but should resolve without problems. Will advise if concerns.  Discussed findings and feel urinary issues may be from dryness. Discussed Vagifem or Premarin cream use to see if decreases occurrence. Patient agreeable.  Rx Vagifem with instructions for use. Re check in 3 months  Lab: Urine declined UA or culture  Continue follow up with PCP as indicated.  Pap smear not taken   counseled on breast self exam, mammography screening, adequate intake of calcium and vitamin D, diet and exercise, Kegel's exercises  return annually or prn  An After Visit Summary was printed and given to the patient.

## 2015-06-17 ENCOUNTER — Telehealth: Payer: Self-pay | Admitting: Certified Nurse Midwife

## 2015-06-17 MED ORDER — ESTROGENS, CONJUGATED 0.625 MG/GM VA CREA
TOPICAL_CREAM | VAGINAL | Status: DC
Start: 2015-06-17 — End: 2015-09-11

## 2015-06-17 NOTE — Telephone Encounter (Signed)
Patient would like to speak with nurse regarding a medication that was called in for her.

## 2015-06-17 NOTE — Telephone Encounter (Signed)
Left message to call Maritza Hosterman at [phone]. 

## 2015-06-17 NOTE — Telephone Encounter (Signed)
Patient went to pick up her prescription and "it is too expensive". Patient does not know the name of the prescription but would like an alternative. Last seen 06/11/15.

## 2015-06-17 NOTE — Telephone Encounter (Signed)
Yes for trial and will need to see back  In 3 months for eval.

## 2015-06-17 NOTE — Telephone Encounter (Signed)
Patient was last seen on 06/11/2015 with [name] CNM for aex. Was started on Vagifem due to urinary symptoms and vaginal dryness. Patient states that this medication is too expensive. Requesting an alternative. At OV on 8/3 Premarin was also discussed.   [name] CNM okay for rx for Premarin to see if this will be more cost effective for patient?

## 2015-06-17 NOTE — Telephone Encounter (Signed)
Spoke with patient. Patient states that she called the pharmacy about her Premarin rx. Was advised this will be $300 dollars. Patient states that she will contact the pharmacy and her insurance company regarding alternative medications and return call for further medication changes.  Routing to provider for final review. Patient agreeable to disposition. Will close encounter.   Patient aware provider will review message and nurse will return call if any additional advice or change of disposition.

## 2015-06-17 NOTE — Telephone Encounter (Signed)
Spoke with patient. Advised patient I have spoken with [name] CNM regarding medication alternative. Patient is agreeable to try Premarin. Advised I will send this over to the pharmacy at this time. Advised if this is too costly to return call to office. Advised may also check with pharmacy and insurance to discuss cheaper alternative. Patient is agreeable. Rx for Premarin place 1/2 gram daily for two weeks then 1/2 gram twice per week #1 2RF sent to pharmacy on file. Patient has follow up scheduled for 09/11/2015 at 11am with [name] CNM.  Routing to provider for final review. Patient agreeable to disposition. Will close encounter.

## 2015-06-19 ENCOUNTER — Telehealth: Payer: Self-pay | Admitting: Certified Nurse Midwife

## 2015-06-19 NOTE — Telephone Encounter (Signed)
Spoke with patient. Patient states that she spoke with the pharmacist and insurance company regarding alternatives to Vagifem or Premarin that may be cheaper and was advised that there are no cheaper alternatives. Patient would like to know if there is an OTC that may help relieve some of her symptoms. Also states that she has two boxes left of there Estradiol 0.025 mg patches. States she does not want to waste them. Asking if she could use these and cut them in half changing them twice a week until these two boxes are gone. Advised I will speak with [name] CNM and return call with further recommendations.

## 2015-06-19 NOTE — Telephone Encounter (Signed)
Patient has questions regarding her medication. 

## 2015-06-20 NOTE — Telephone Encounter (Signed)
She can complete her boxes. No other refills. Start with coconut oil pure, every hs for vaginal dryness. Recheck in one month, please make appointment for her.

## 2015-06-20 NOTE — Telephone Encounter (Signed)
Spoke with patient. Advised of message as seen below from [place]. Patient is agreeable and verbalizes understanding. Patient states "I already scheduled a 3 month recheck for the same thing when I left my last visit." patient declines to schedule earlier appointment. Would like to keep the 3 month recheck. Recheck is scheduled for 09/11/2015 at 11am. Will call if she needs anything prior to that appointment.  Routing to provider for final review. Patient agreeable to disposition. Will close encounter.

## 2015-08-06 ENCOUNTER — Other Ambulatory Visit: Payer: Self-pay

## 2015-08-06 DIAGNOSIS — Z1231 Encounter for screening mammogram for malignant neoplasm of breast: Secondary | ICD-10-CM

## 2015-09-02 ENCOUNTER — Ambulatory Visit
Admission: RE | Admit: 2015-09-02 | Discharge: 2015-09-02 | Disposition: A | Discharge status: Home / Self Care | Payer: Medicare Other | Source: Ambulatory Visit

## 2015-09-02 DIAGNOSIS — Z1231 Encounter for screening mammogram for malignant neoplasm of breast: Secondary | ICD-10-CM

## 2015-09-11 ENCOUNTER — Encounter: Payer: Self-pay | Admitting: Certified Nurse Midwife

## 2015-09-11 ENCOUNTER — Ambulatory Visit (INDEPENDENT_AMBULATORY_CARE_PROVIDER_SITE_OTHER): Payer: Medicare Other | Admitting: Certified Nurse Midwife

## 2015-09-11 VITALS — BP 142/84 | HR 72 | Ht 64.0 in | Wt 143.0 lb

## 2015-09-11 DIAGNOSIS — N8110 Cystocele, unspecified: Secondary | ICD-10-CM

## 2015-09-11 DIAGNOSIS — IMO0001 Reserved for inherently not codable concepts without codable children: Secondary | ICD-10-CM

## 2015-09-11 DIAGNOSIS — N951 Menopausal and female climacteric states: Secondary | ICD-10-CM

## 2015-09-11 DIAGNOSIS — IMO0002 Reserved for concepts with insufficient information to code with codable children: Principal | ICD-10-CM

## 2015-09-11 NOTE — Patient Instructions (Signed)

## 2015-09-12 ENCOUNTER — Encounter: Payer: Self-pay | Admitting: Certified Nurse Midwife

## 2015-09-12 NOTE — Progress Notes (Signed)
69 y.o. Married Caucasian female G1P1001 here for follow up of vaginal dryness and urinary intermittent urinary frequency. Patient tried coconut oil for dryness and it is just was to much trouble. "I will be turning 69 in the next few days and this is all I have problems with not worried about it". Patient also has some bladder prolapse and feels this may have increased. Occasional stress incontinence and incomplete emptying, but not a problem. Denies urinary urgency, pain or odor. Not really sexually active, so dryness not an issue. Denies any vaginal bleeding or vaginal pain.  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 Discussed importance of vaginal moisture to prevent UTI and vaginal infections or discomfort. Reviewed pamphlet on support options for cystocele and ? Surgical option if she feels is a problem. Shown pessary option for support. Questions addressed. Patient not interested in any option at this time. Will work with her kegels and advise if symptoms increase. Given disposable applicator sample for use with coconut oil to see if she can work with this for moisture. She feels she can with applicator use. Will advise if any problems.  Rv prn,aex   Labs   Instructions given regarding:  RV

## 2015-09-16 NOTE — Progress Notes (Signed)
Encounter reviewed Tene Gato, MD   

## 2015-10-14 ENCOUNTER — Telehealth: Payer: Self-pay | Admitting: Certified Nurse Midwife

## 2015-10-14 NOTE — Telephone Encounter (Signed)
Patient is asking to talk with Evalee Mutton, CNM about her most recent visit. Last seen 09/11/15.

## 2015-10-14 NOTE — Telephone Encounter (Signed)
Spoke with patient. Patient states that at her last OV she spoke with Melvia Heaps CNM about surgery for her bladder. Patient has some additional questions for Melvia Heaps CNM. Is thinking she may want to proceed. Advised she will need to be seen for a consultation with Dr.Sivla ito discuss if she would like to move forward. Patient is requesting that Melvia Heaps CNM call her to speak with her in regards to her questions prior to scheduling. Advised I will send a message over to [facility], but can not guarantee a time of return call as Melvia Heaps CNM is seeing patients today. Patient is agreeable.

## 2015-10-16 NOTE — Telephone Encounter (Signed)
Phone call to patient. Patient had concerns about her bladder " falling out". Was seen office with complaint of pressure and noted to have grade 3 cystocele. Patient at that time had some incomplete emptying of bladder, but now no issues. Calling discuss options regarding trial of pessary versus surgery. Patient has tremors feels she may not be able to manage pessary as an option discussed. She also is aware of surgical option. Discussed trial of pessary and if comfortable she can manage this way. Patient does not like the idea of something in her body. Discussed would recommend an evaluation with Dr. Quincy Simmonds regarding repair. She has already had a hysterectomy and has essentially no health issues. Discussed I would encourage an evaluation with Dr Quincy Simmonds and investigate this option, then could decide. Patient plans to schedule in 1/17 and will call when she is ready. Stressed if any problems or changes to call and come in.

## 2015-11-11 ENCOUNTER — Telehealth: Payer: Self-pay | Admitting: Certified Nurse Midwife

## 2015-11-11 NOTE — Telephone Encounter (Signed)
Spoke with patient. Patient states that she would like to come in to consult with Dr.Silva regarding possible "bladder surgery." Denies any current concerns or problems. States she is ready to possibly move forward with surgery at this time. Appointment scheduled for 11/19/2015 at 9 am with Dr.Silva. Patient is agreeable to date and time.  Routing to provider for final review. Patient agreeable to disposition. Will close encounter.

## 2015-11-11 NOTE — Telephone Encounter (Signed)
Patient is asking to talk with [name] nurse regarding "possible bladder surgery".

## 2015-11-19 ENCOUNTER — Encounter: Payer: Self-pay | Admitting: Obstetrics and Gynecology

## 2015-11-19 ENCOUNTER — Ambulatory Visit (INDEPENDENT_AMBULATORY_CARE_PROVIDER_SITE_OTHER): Payer: Medicare Other | Admitting: Obstetrics and Gynecology

## 2015-11-19 VITALS — BP 150/78 | HR 70 | Resp 18 | Ht 64.0 in | Wt 148.0 lb

## 2015-11-19 DIAGNOSIS — N816 Rectocele: Secondary | ICD-10-CM

## 2015-11-19 DIAGNOSIS — R350 Frequency of micturition: Secondary | ICD-10-CM | POA: Diagnosis not present

## 2015-11-19 DIAGNOSIS — N8110 Cystocele, unspecified: Secondary | ICD-10-CM

## 2015-11-19 DIAGNOSIS — IMO0002 Reserved for concepts with insufficient information to code with codable children: Secondary | ICD-10-CM

## 2015-11-19 NOTE — Progress Notes (Signed)
GYNECOLOGY  VISIT   HPI: 70 y.o.   Married  Caucasian  female   G1P1001 with No LMP recorded. Patient has had a hysterectomy.   Status post TAH for endometriosis in her 27s.  Ovaries remain.  here for   Urinary incontinence - surgery options.  Has known cystocele which was noted at examination in November 2016, and patient very worried about this diagnosis.  Patient with frequency and urinary incontinence for about 3 months prior to this.  DF - depends on fluid intake.   NF - 1 -2 times per hs.  No urge incontinence.  No leakage without warning.  No specific leakage with cough, laugh, sneeze, or exercise. Uses panty liner for protection.  Void well.   No constipation or fecal incontinence.   Not sexually active lately since dx of cystocele.   Vaginal delivery 9 pounds, 6 ounces.  No operative delivery.  No extensive tearing.   GYNECOLOGIC HISTORY: No LMP recorded. Patient has had a hysterectomy. Contraception: Hysterectomy  Menopausal hormone therapy: none Last mammogram: 09/03/15 BIRADS1:Neg Last pap smear: 05/08/09 Neg        OB History    Gravida Para Term Preterm AB TAB SAB Ectopic Multiple Living   1 1 1       1          Patient Active Problem List   Diagnosis Date Noted  . Osteoporosis, unspecified 06/04/2014    Class: History of    Past Medical History  Diagnosis Date  . 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 Allergy   . Fibromyalgia   . Endometriosis   . Osteoporosis   . Osteoporosis, unspecified 06/04/2014    PCP management with medication    Past Surgical History  Procedure Laterality Date  . Stapedes surgery      left ear  . Appendectomy    . Total abdominal hysterectomy  1980's  . Appendectomy      Current Outpatient Prescriptions  Medication Sig Dispense Refill  . CALCIUM PO Take by mouth daily. + d    . Cholecalciferol (VITAMIN D PO) Take by mouth daily.    Marland Kitchen CINNAMON PO Take by mouth as needed.    . diclofenac (VOLTAREN) 50 MG EC  tablet daily.    Marland Kitchen gabapentin (NEURONTIN) 300 MG capsule Take 300 mg by mouth 3 (three) times daily.    Marland Kitchen ibandronate (BONIVA) 150 MG tablet Take 150 mg by mouth every 30 (thirty) days. Take in the morning with a full glass of water, on an empty stomach, and do not take anything else by mouth or lie down for the next 30 min.    . mometasone (NASONEX) 50 MCG/ACT nasal spray Place 2 sprays into the nose as needed.    . Multiple Vitamin (MULTIVITAMIN) tablet Take 1 tablet by mouth daily.    . Omega-3 Fatty Acids (FISH OIL) 500 MG CAPS Take by mouth.     No current facility-administered medications for this visit.     ALLERGIES: Amoxicillin and Eggs or egg-derived products  Family History  Problem Relation Age of Onset  . Heart disease Father   . Stroke Father   . Hypertension Father   . Alzheimer's disease Mother   . Osteoporosis Mother     Social History   Social History  . Marital Status: Married    Spouse Name: N/A  . Number of Children: N/A  . Years of Education: N/A   Occupational History  .  Not on file.   Social History Main Topics  . Smoking status: Never Smoker   . Smokeless tobacco: Never Used  . Alcohol Use: No  . Drug Use: No  . Sexual Activity:    Partners: Male    Birth Control/ Protection: Surgical     Comment: TAH   Other Topics Concern  . Not on file   Social History Narrative    ROS:  Pertinent items are noted in HPI.  PHYSICAL EXAMINATION:    BP 150/78 mmHg  Pulse 70  Resp 18  Ht 5\' 4"  (1.626 m)  Wt 148 lb (67.132 kg)  BMI 25.39 kg/m2    General appearance: alert, cooperative and appears stated age Abdomen:  Vertical incision?  Pfannenstiel incision. No masses, organomegaly, or tenderness.  Pelvic: External genitalia:  no lesions              Urethra:  normal appearing urethra with no masses, tenderness or lesions              Bartholins and Skenes: normal                 Vagina: normal appearing vagina with normal color and discharge, no  lesions.  Second degree cystocele, first degree rectocele.  Good vaginal apex support.                Cervix: absent                Bimanual Exam:  Uterus:  uterus absent              Adnexa: normal adnexa and no mass, fullness, tenderness              Rectovaginal: Yes.  .  Confirms.              Anus:  normal sphincter tone, no lesions.  Has large external hemorrhoid.  Chaperone was present for exam.  ASSESSMENT  Second degree cystocele.  First degree rectocele.  Urinary frequency - variable.  Hemorrhoid.   PLAN  I have had a comprehensive discussion with the patient regarding prolapse and urinary incontinence.  I also have provided reading materials from [employer] regarding prolapse and incontinence in general as well as medical and surgical treatment for these conditions. I have instructed the patient in pelvic anatomy using a 3D model.   Medical treatments may include observational management, physical therapy, pessary use, anticholinergic treatment, and bladder irritant reduction.  We discussed potential anterior and posterior colporrhaphy.  We discussed urodynamic testing if patient chooses to proceed with surgical care.   I have discussed surgical expectations regarding the procedures and recovery.  Recurrence of prolapse discussed.  Patient states she feels much relieved after our discussion today.  She wishes for observational management.  She understands my recommendation to return for worsening or bladder control, difficulty voiding, urinary tract infections, or progression of the prolapse.  An After Visit Summary was printed and given to the patient.  __25____ minutes face to face time of which over 50% was spent in counseling.

## 2015-11-19 NOTE — Patient Instructions (Signed)
Please return for any questions and rediscussion of these complex issues!

## 2015-12-08 ENCOUNTER — Other Ambulatory Visit: Payer: Self-pay | Admitting: Obstetrics and Gynecology

## 2015-12-08 ENCOUNTER — Telehealth: Payer: Self-pay | Admitting: Certified Nurse Midwife

## 2015-12-08 ENCOUNTER — Encounter: Payer: Self-pay | Admitting: Emergency Medicine

## 2015-12-08 DIAGNOSIS — R350 Frequency of micturition: Secondary | ICD-10-CM

## 2015-12-08 DIAGNOSIS — IMO0002 Reserved for concepts with insufficient information to code with codable children: Principal | ICD-10-CM

## 2015-12-08 DIAGNOSIS — IMO0001 Reserved for inherently not codable concepts without codable children: Secondary | ICD-10-CM

## 2015-12-08 NOTE — Telephone Encounter (Signed)
Patient is asking to talk with Dr.Silva's nurse regarding possible bladder surgery. Last seen 11/19/15.

## 2015-12-08 NOTE — Telephone Encounter (Signed)
Returned call to patient. She states she has thought about her options and would really like to proceed forward with surgical plans and wants to know what to do next in the process.   Reviewed Dr. Elza Rafter notes.  Patient will need to have urodynamics prior to making surgical plans.  Patient is ready to proceed. Urinalysis scheduled for 12/19/15 and urodynamics scheduled for 12/24/15. Patient has been using Oxytrol patches and will stop those one week before Urodynamics. Consult with Dr. Quincy Simmonds scheduled for 12/26/15 to discuss results.  Routing to Dr. Quincy Simmonds to review.  Okay to order urodynamics for pre-certification and proceed as scheduled?

## 2015-12-08 NOTE — Telephone Encounter (Signed)
I have just placed an order for urodynamic testing.  I agree with stopping the Oxytrol patches one week prior to testing.  Thank you.   Cc- [name]

## 2015-12-09 ENCOUNTER — Encounter: Payer: Self-pay | Admitting: Emergency Medicine

## 2015-12-09 NOTE — Telephone Encounter (Signed)
Instruction letter mailed to patient home address of record. Will close encounter.

## 2015-12-19 ENCOUNTER — Encounter: Payer: Self-pay | Admitting: *Deleted

## 2015-12-19 ENCOUNTER — Ambulatory Visit (INDEPENDENT_AMBULATORY_CARE_PROVIDER_SITE_OTHER): Payer: Medicare Other | Admitting: *Deleted

## 2015-12-19 VITALS — BP 136/84 | HR 72

## 2015-12-19 DIAGNOSIS — Z01812 Encounter for preprocedural laboratory examination: Secondary | ICD-10-CM

## 2015-12-19 NOTE — Progress Notes (Signed)
Patient presented to office for pre-urodynamics urine dipstick.  Pt is not having any urinary pain, pressure, frequency, urgency or low back pain.  Urinalysis negative.  Advised pt no further follow up needed and to keep appointment on Wednesday.

## 2015-12-24 ENCOUNTER — Ambulatory Visit (INDEPENDENT_AMBULATORY_CARE_PROVIDER_SITE_OTHER): Payer: Medicare Other | Admitting: Obstetrics and Gynecology

## 2015-12-24 VITALS — BP 132/80 | HR 80 | Resp 14 | Wt 147.0 lb

## 2015-12-24 DIAGNOSIS — N393 Stress incontinence (female) (male): Secondary | ICD-10-CM

## 2015-12-24 NOTE — Progress Notes (Signed)
Patient here for urodynamic testing. See report attached.

## 2015-12-26 ENCOUNTER — Encounter: Payer: Self-pay | Admitting: Obstetrics and Gynecology

## 2015-12-26 ENCOUNTER — Ambulatory Visit (INDEPENDENT_AMBULATORY_CARE_PROVIDER_SITE_OTHER): Payer: Medicare Other | Admitting: Obstetrics and Gynecology

## 2015-12-26 VITALS — BP 124/72 | HR 66 | Resp 14 | Ht 64.0 in | Wt 147.4 lb

## 2015-12-26 DIAGNOSIS — N393 Stress incontinence (female) (male): Secondary | ICD-10-CM

## 2015-12-26 DIAGNOSIS — N816 Rectocele: Secondary | ICD-10-CM

## 2015-12-26 DIAGNOSIS — N8110 Cystocele, unspecified: Secondary | ICD-10-CM

## 2015-12-26 DIAGNOSIS — IMO0002 Reserved for concepts with insufficient information to code with codable children: Secondary | ICD-10-CM

## 2015-12-26 NOTE — Progress Notes (Signed)
GYNECOLOGY  VISIT   HPI: 70 y.o.   Married female   G1P1001 with No LMP recorded. Patient has had a hysterectomy.   here for   Review of urodynamic results.  Has a second degree cystocele and a first degree rectocele.  Prior hysterectomy for endometriosis.  Ovaries remain.   No specific leak with cough, laugh, or sneeze.  Multichannel urodynamics performed on 12/24/15 with reduction of the prolapse using a pessary.  Uroflow - void 90 cc.  PVR 25 cc.  Not continuous flow. CMG - S1 362 cc, S2 576 cc, S3 662 cc.   LMPP 101 cm H2O at 699 cc. Stable CMG. UPP 17 cm H2O.  Report from technician that the UPP was difficult to perform.  Pressure flow study - Pdet max 10 cm H2O.   Voided 873 cc.  GYNECOLOGIC HISTORY: No LMP recorded. Patient has had a hysterectomy. Contraception: hysterectomy. Menopausal hormone therapy: none Last mammogram: 09/03/15 BIRADS1:Neg Last pap smear: 05/08/09 Neg          OB History    Gravida Para Term Preterm AB TAB SAB Ectopic Multiple Living   1 1 1       1          Patient Active Problem List   Diagnosis Date Noted  . Osteoporosis, unspecified 06/04/2014    Class: History of    Past Medical History  Diagnosis Date  . 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 Allergy   . Fibromyalgia   . Endometriosis   . Osteoporosis   . Osteoporosis, unspecified 06/04/2014    PCP management with medication    Past Surgical History  Procedure Laterality Date  . Stapedes surgery      left ear  . Appendectomy    . Total abdominal hysterectomy  1980's  . Appendectomy      Current Outpatient Prescriptions  Medication Sig Dispense Refill  . CALCIUM PO Take by mouth daily. + d    . Cholecalciferol (VITAMIN D PO) Take by mouth daily.    Marland Kitchen CINNAMON PO Take by mouth as needed.    . diclofenac (VOLTAREN) 50 MG EC tablet daily.    Marland Kitchen gabapentin (NEURONTIN) 300 MG capsule Take 300 mg by mouth 3 (three) times daily.    Marland Kitchen ibandronate (BONIVA) 150 MG tablet  Take 150 mg by mouth every 30 (thirty) days. Take in the morning with a full glass of water, on an empty stomach, and do not take anything else by mouth or lie down for the next 30 min.    . mometasone (NASONEX) 50 MCG/ACT nasal spray Place 2 sprays into the nose as needed.    . Multiple Vitamin (MULTIVITAMIN) tablet Take 1 tablet by mouth daily.    . Omega-3 Fatty Acids (FISH OIL) 500 MG CAPS Take by mouth.     No current facility-administered medications for this visit.     ALLERGIES: Amoxicillin and Eggs or egg-derived products  Family History  Problem Relation Age of Onset  . Heart disease Father   . Stroke Father   . Hypertension Father   . Alzheimer's disease Mother   . Osteoporosis Mother     Social History   Social History  . Marital Status: Married    Spouse Name: N/A  . Number of Children: N/A  . Years of Education: N/A   Occupational History  . Not on file.   Social History Main Topics  . Smoking status: Never Smoker   .  Smokeless tobacco: Never Used  . Alcohol Use: No  . Drug Use: No  . Sexual Activity:    Partners: Male    Birth Control/ Protection: Surgical     Comment: TAH   Other Topics Concern  . Not on file   Social History Narrative    ROS:  Pertinent items are noted in HPI.  PHYSICAL EXAMINATION:    There were no vitals taken for this visit.    General appearance: alert, cooperative and appears stated age Tremor of head noted.   ASSESSMENT  Cystocele.  Rectocele.  Genuine stress incontinence - occult.  PLAN  Counseled regarding urodynamic testing and occult stress incontinence. Counseled regarding cystocele and rectocele.  I discussed medical and surgical options for prolapse and incontinence. Medical options include physical therapy and pessary use. Surgical care would include an anterior and posterior colporrhaphy with native tissue repair, TVT Exact midurethral sling and cystoscopy.   Benefits and risks of surgery discussed.    Risks of surgery include but are not limited to bleeding, infection, damage to surrounding organs, ureteral damage, vaginal pain with sexual activity, permanent mesh use which may cause erosion and exposure in the vagina, urethra, bladder or ureters, dyspareunia, slower voiding and urinary retention, possible need for prolonged catheterization and/or self catheterization, de novo overactive bladder symptoms, reoperation, recurrence of prolapse and incontinence,  DVT, PE, death, and reaction to anesthesia.    I have discussed surgical expectations regarding the procedures and success rates, outcomes, and recovery.     Patient wishes to proceed forward with surgical precert and scheduling.  Interested in surgery as soon as possible.  Has an appt with PCP on 01/23/16.   An After Visit Summary was printed and given to the patient.  ___25___ minutes face to face time of which over 50% was spent in counseling.

## 2015-12-27 NOTE — Progress Notes (Signed)
Patient ID: [name], female   DOB: Jan 19, 1946, 70 y.o.   MRN: [id number]  Encounter reviewed by Dr. Aundria Rud.  Multichannel urodynamics performed on 12/24/15 with reduction of the prolapse using a pessary.  Uroflow - void 90 cc.  PVR 25 cc.  Not continuous flow. CMG - S1 362 cc, S2 576 cc, S3 662 cc.   LMPP 101 cm H2O at 699 cc. Stable CMG. UPP 17 cm H2O.  Report from technician that the UPP was difficult to perform.  Pressure flow study - Pdet max 10 cm H2O.   Voided 873 cc.

## 2015-12-30 ENCOUNTER — Telehealth: Payer: Self-pay | Admitting: *Deleted

## 2015-12-30 NOTE — Telephone Encounter (Signed)
Call to patient to discuss surgery date options. Patient is in grocery store and states she will call back.

## 2015-12-30 NOTE — Telephone Encounter (Signed)
Returning a call to [name]. °

## 2015-12-30 NOTE — Telephone Encounter (Signed)
Return call to patient. Discussed available date options. Patient agrees to 02-17-16 unless earlier date becomes available. Will schedule and call patient back.

## 2015-12-31 NOTE — Telephone Encounter (Signed)
Patient returning your call.

## 2016-01-01 NOTE — Telephone Encounter (Signed)
Call to patient. Discussed surgery dates. Advised 01-27-16 is not available. Offered 01-20-16 but patient declines this date due to medical appointment on 01-23-16. Advised will contact her back as soon as date for 02-17-16 can be confirmed.

## 2016-01-01 NOTE — Telephone Encounter (Signed)
Patient called requesting to speak with Sally. °

## 2016-01-02 NOTE — Telephone Encounter (Signed)
Call to patient. Advised surgery is scheduled for 02-17-16 at 0730 at [facility]. Patient to arrive at 0600.  She requests to be changed to later start time if possible and is advised this will be determined closer to date of surgery. Surgery instruction sheet reviewed and printed copy mailed. See copy scanned to chart.   Routing to provider for final review. Patient agreeable to disposition. Will close encounter.

## 2016-01-13 ENCOUNTER — Telehealth: Payer: Self-pay | Admitting: Obstetrics and Gynecology

## 2016-01-13 NOTE — Telephone Encounter (Signed)
Message left to return call to Cecille Mcclusky at [phone].    

## 2016-01-13 NOTE — Telephone Encounter (Signed)
Patient returned call

## 2016-01-13 NOTE — Telephone Encounter (Signed)
Return call to patient. She has two questions for Dr. Quincy Simmonds:   1) Is she okay to continue with Oxytrol Patch that she purchases OTC until surgery?   2) Can she schedule a beach trip on 04/04/16 (this will be almost 7 weeks post op).   Advised patient will send message to Dr. Quincy Simmonds to review and return call with response. Patient is agreeable.

## 2016-01-13 NOTE — Telephone Encounter (Signed)
Patient has some questions regarding her upcoming surgery.

## 2016-01-14 NOTE — Telephone Encounter (Signed)
Message left to return call to [name] at [phone].    

## 2016-01-14 NOTE — Telephone Encounter (Signed)
Patient returned call and she is given messages from Dr. Quincy Simmonds.  Verbalizes understanding and will follow instructions as given.  Will call back prn. Routing to provider for final review. Patient agreeable to disposition. Will close encounter.

## 2016-01-14 NOTE — Telephone Encounter (Signed)
I recommend she stop the Oxytrol 1 week prior to surgery.  She may travel to the beach 7 weeks post op.

## 2016-01-22 ENCOUNTER — Telehealth: Payer: Self-pay | Admitting: Obstetrics and Gynecology

## 2016-01-22 NOTE — Telephone Encounter (Signed)
Patient is returning a call to Tracy. °

## 2016-01-22 NOTE — Telephone Encounter (Signed)
Call to patient. No open phone messages. Patient states she had an old message on her home phone. She will follow up as needed.  Routing to provider for final review. Patient agreeable to disposition. Will close encounter.

## 2016-01-30 ENCOUNTER — Encounter: Payer: Self-pay | Admitting: Obstetrics and Gynecology

## 2016-01-30 ENCOUNTER — Ambulatory Visit (INDEPENDENT_AMBULATORY_CARE_PROVIDER_SITE_OTHER): Payer: Medicare Other | Admitting: Obstetrics and Gynecology

## 2016-01-30 VITALS — Ht 64.0 in

## 2016-01-30 DIAGNOSIS — IMO0002 Reserved for concepts with insufficient information to code with codable children: Secondary | ICD-10-CM

## 2016-01-30 DIAGNOSIS — N8110 Cystocele, unspecified: Secondary | ICD-10-CM | POA: Diagnosis not present

## 2016-01-30 DIAGNOSIS — N393 Stress incontinence (female) (male): Secondary | ICD-10-CM | POA: Diagnosis not present

## 2016-01-30 DIAGNOSIS — N816 Rectocele: Secondary | ICD-10-CM | POA: Diagnosis not present

## 2016-01-30 NOTE — Patient Instructions (Signed)
I will see you for surgery! 

## 2016-01-30 NOTE — Progress Notes (Signed)
Patient ID: [name], female   DOB: October 18, 1946, 70 y.o.   MRN: [id number]  GYNECOLOGY  VISIT   HPI: 70 y.o.   Married  Caucasian  female   G1P1001 with No LMP recorded. Patient has had a hysterectomy.   here for surgery consult.  Has a second degree cystocele and a first degree rectocele.  Prior hysterectomy for endometriosis. Ovaries remain.   Not really sexually active.   No specific leak with cough, laugh, or sneeze. Has urinary frequency with night time frequency. Uses Oxytrol patch.  Multichannel urodynamics performed on 12/24/15 with reduction of the prolapse using a pessary.  Uroflow - void 90 cc. PVR 25 cc. Not continuous flow. CMG - S1 362 cc, S2 576 cc, S3 662 cc. LMPP 101 cm H2O at 699 cc. Stable CMG. UPP 17 cm H2O. Report from technician that the UPP was difficult to perform.  Pressure flow study - Pdet max 10 cm H2O. Voided 873 cc.  Denies fecal incontinence or constipation with splinting.   Stopped Diclofenac which was for fibromyalgia and arthritis pain.  Sees Dr. Trudie Reed for this.   Just started Zetia for elevated cholesterol.  PCP - Crist Infante, MD  GYNECOLOGIC HISTORY: No LMP recorded. Patient has had a hysterectomy. Contraception: hysterectomy Menopausal hormone therapy: Ortho-Est in past, but none currently Last mammogram: 09/02/15, 3D, Heterogeneously dense breast tissue; Bi-Rads 1:  Negative Last pap smear: 05/08/09, Negative         OB History    Gravida Para Term Preterm AB TAB SAB Ectopic Multiple Living   1 1 1       1          Patient Active Problem List   Diagnosis Date Noted  . Osteoporosis, unspecified 06/04/2014    Class: History of    Past Medical History  Diagnosis Date  . 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 Allergy   . Fibromyalgia   . Endometriosis   . Osteoporosis   . Osteoporosis, unspecified 06/04/2014    PCP management with medication    Past Surgical History  Procedure Laterality Date  .  Stapedes surgery      left ear  . Appendectomy    . Total abdominal hysterectomy  1980's  . Appendectomy      Current Outpatient Prescriptions  Medication Sig Dispense Refill  . CALCIUM PO Take by mouth daily. + d    . Cholecalciferol (VITAMIN D PO) Take by mouth daily.    Marland Kitchen CINNAMON PO Take by mouth as needed.    . gabapentin (NEURONTIN) 300 MG capsule Take 300 mg by mouth 3 (three) times daily.    Marland Kitchen ibandronate (BONIVA) 150 MG tablet Take 150 mg by mouth every 30 (thirty) days. Take in the morning with a full glass of water, on an empty stomach, and do not take anything else by mouth or lie down for the next 30 min.    . Multiple Vitamin (MULTIVITAMIN) tablet Take 1 tablet by mouth daily.    . Omega-3 Fatty Acids (FISH OIL) 500 MG CAPS Take by mouth.    . oxybutynin (OXYTROL) 3.9 MG/24HR Place 1 patch onto the skin every 3 (three) days.    Marland Kitchen triamcinolone (NASACORT AQ) 55 MCG/ACT AERO nasal inhaler Place 2 sprays into the nose daily.    Marland Kitchen ZETIA 10 MG tablet Take 1 tablet by mouth daily.  11   No current facility-administered medications for this visit.  ALLERGIES: Amoxicillin and Eggs or egg-derived products  Family History  Problem Relation Age of Onset  . Heart disease Father   . Stroke Father   . Hypertension Father   . Alzheimer's disease Mother   . Osteoporosis Mother     Social History   Social History  . Marital Status: Married    Spouse Name: N/A  . Number of Children: N/A  . Years of Education: N/A   Occupational History  . Not on file.   Social History Main Topics  . Smoking status: Never Smoker   . Smokeless tobacco: Never Used  . Alcohol Use: No  . Drug Use: No  . Sexual Activity:    Partners: Male    Birth Control/ Protection: Surgical     Comment: TAH   Other Topics Concern  . Not on file   Social History Narrative    ROS:  Pertinent items are noted in HPI.  PHYSICAL EXAMINATION:    Ht 5\' 4"  (1.626 m)    General appearance: alert,  cooperative and appears stated age.  Has a resting tremor of the head. Head: Normocephalic, without obvious abnormality, atraumatic Neck: no adenopathy, supple, symmetrical, trachea midline and thyroid normal to inspection and palpation Lungs: clear to auscultation bilaterally   Heart: regular rate and rhythm Abdomen: soft, non-tender; bowel sounds normal; no masses,  no organomegaly Extremities: extremities normal, atraumatic, no cyanosis or edema Skin: Skin color, texture, turgor normal. No rashes or lesions Lymph nodes: Cervical, supraclavicular, and axillary nodes normal. No abnormal inguinal nodes palpated Neurologic: Grossly normal  Pelvic: External genitalia:  no lesions              Urethra:  normal appearing urethra with no masses, tenderness or lesions              Bartholins and Skenes: normal                 Vagina: normal appearing vagina with normal color and discharge, no lesions.  Second degree cystocele and first degree rectocele.  Good apical support.              Cervix: absent       Bimanual Exam:  Uterus:  uterus absent              Adnexa: normal adnexa and no mass, fullness, tenderness              Rectovaginal: Yes.  .  Confirms.              Anus:  normal sphincter tone,  Hemorrhoid noted.  Chaperone was present for exam.  ASSESSMENT  Status post TAH.  Ovaries remain.  Cystocele.  Rectocele.  Genuine stress incontinence - occult. Urinary frequency and nocturia treated with Oxytrol.   PLAN   Surgical care will be an anterior and posterior colporrhaphy with native tissue repair, TVT Exact midurethral sling and cystoscopy.  Patient is aware that the TVT Exact uses a permanent mesh.  We have discussed mesh issues of exposures and erosions that can lead to pain, complications, and further surgical care. Risks, benefits, and alternatives to surgery discussed, and the patient wishes to proceed. Surgical expectations and recovery discussed. Stop Oxytrol 5  days prior to surgery.    An After Visit Summary was printed and given to the patient.  __25____ minutes face to face time of which over 50% was spent in counseling.

## 2016-02-04 NOTE — Patient Instructions (Signed)
Your procedure is scheduled on:  Tuesday, February 17, 2016  Enter through the Main Entrance of [facility] at: 6:00 AM  Pick up the phone at the desk and dial [phone].  Call this number if you have problems the morning of surgery: [phone].  Remember: Do NOT eat food or drink after:  Midnight Monday, February 16, 2016  Take these medicines the morning of surgery with a SIP OF WATER: Gabapentin, Zetia  Stop taking Fish Oil at this time.  Do NOT wear jewelry (body piercing), metal hair clips/bobby pins, make-up, or nail polish. Do NOT wear lotions, powders, or perfumes.  You may wear deodorant. Do NOT shave for 48 hours prior to surgery. Do NOT bring valuables to the hospital. Contacts, dentures, or bridgework may not be worn into surgery. Leave suitcase in car.  After surgery it may be brought to your room.  For patients admitted to the hospital, checkout time is 11:00 AM the day of discharge. Have a responsible adult drive you home and stay with you for 24 hours after your procedure

## 2016-02-05 ENCOUNTER — Encounter (HOSPITAL_COMMUNITY)
Admission: RE | Admit: 2016-02-05 | Discharge: 2016-02-05 | Disposition: A | Discharge status: Home / Self Care | Payer: Medicare Other | Source: Ambulatory Visit | Attending: Obstetrics and Gynecology | Admitting: Obstetrics and Gynecology

## 2016-02-05 ENCOUNTER — Encounter (HOSPITAL_COMMUNITY): Payer: Self-pay

## 2016-02-05 ENCOUNTER — Other Ambulatory Visit: Payer: Self-pay

## 2016-02-05 DIAGNOSIS — Z01812 Encounter for preprocedural laboratory examination: Secondary | ICD-10-CM | POA: Diagnosis present

## 2016-02-05 DIAGNOSIS — Z01810 Encounter for preprocedural cardiovascular examination: Secondary | ICD-10-CM | POA: Diagnosis present

## 2016-02-05 HISTORY — DX: Unspecified osteoarthritis, unspecified site: M19.90

## 2016-02-05 HISTORY — DX: Nausea with vomiting, unspecified: R11.2

## 2016-02-05 HISTORY — DX: Other specified postprocedural states: Z98.890

## 2016-02-05 LAB — CBC
HCT: 39.5 % (ref 36.0–46.0)
Hemoglobin: 13.3 g/dL (ref 12.0–15.0)
MCH: 31.3 pg (ref 26.0–34.0)
MCHC: 33.7 g/dL (ref 30.0–36.0)
MCV: 92.9 fL (ref 78.0–100.0)
Platelets: 243 10*3/uL (ref 150–400)
RBC: 4.25 MIL/uL (ref 3.87–5.11)
RDW: 13.3 % (ref 11.5–15.5)
WBC: 7.7 10*3/uL (ref 4.0–10.5)

## 2016-02-05 LAB — BASIC METABOLIC PANEL
Anion gap: 6 (ref 5–15)
BUN: 11 mg/dL (ref 6–20)
CO2: 29 mmol/L (ref 22–32)
Calcium: 9.6 mg/dL (ref 8.9–10.3)
Chloride: 105 mmol/L (ref 101–111)
Creatinine, Ser: 0.52 mg/dL (ref 0.44–1.00)
GFR calc Af Amer: >60 mL/min (ref >60)
GFR calc non Af Amer: >60 mL/min (ref >60)
Glucose, Bld: 101 mg/dL — ABNORMAL HIGH (ref 65–99)
Potassium: 4.5 mmol/L (ref 3.5–5.1)
Sodium: 140 mmol/L (ref 135–145)

## 2016-02-09 ENCOUNTER — Telehealth: Payer: Self-pay | Admitting: Obstetrics and Gynecology

## 2016-02-09 NOTE — Telephone Encounter (Signed)
Encounter closed in error.

## 2016-02-09 NOTE — Telephone Encounter (Signed)
Patient is having surgery next week and has a question for Dr.Silva. Patient is asking "will I be able to go up and down my stairs after surgery".

## 2016-02-09 NOTE — Telephone Encounter (Signed)
Patient is scheduled on 02/17/2016 for anterior (cystocele) and posterior (rectocele) repair on 02/17/2016 with Dr.Silva. Patient is asking how soon after her surgery she can go up and down the starts in her house.  Routing to Conner for review as Dr.Silva is out of the office.

## 2016-02-09 NOTE — Telephone Encounter (Signed)
Spoke with patient. Advised of message as seen below form Dr.Jertson. She is agreeable and verbalizes understanding.  Routing to provider for final review. Patient agreeable to disposition. Will close encounter.

## 2016-02-09 NOTE — Telephone Encounter (Signed)
She will be able to go up and down stairs when she goes home. She just may need to take them a little slow.

## 2016-02-16 MED ORDER — METRONIDAZOLE IN NACL 5-0.79 MG/ML-% IV SOLN
500.0000 mg | INTRAVENOUS | Status: AC
  Administered 2016-02-17: 500 mg via INTRAVENOUS
  Filled 2016-02-16: qty 100

## 2016-02-16 MED ORDER — CIPROFLOXACIN IN D5W 400 MG/200ML IV SOLN
400.0000 mg | INTRAVENOUS | Status: AC
  Administered 2016-02-17: 400 mg via INTRAVENOUS
  Filled 2016-02-16: qty 200

## 2016-02-16 NOTE — H&P (Signed)
Patient ID: [name], female DOB: [birth year], 70 y.o. MRN: [id number]  GYNECOLOGY VISIT  HPI: 70 y.o. Married Caucasian female  [id number] with No LMP recorded. Patient has had a hysterectomy.  here for surgery consult.  Has a second degree cystocele and a first degree rectocele.  Prior hysterectomy for endometriosis. Ovaries remain.   Not really sexually active.   No specific leak with cough, laugh, or sneeze. Has urinary frequency with night time frequency. Uses Oxytrol patch.  Multichannel urodynamics performed on 12/24/15 with reduction of the prolapse using a pessary.  Uroflow - void 90 cc. PVR 25 cc. Not continuous flow. CMG - S1 362 cc, S2 576 cc, S3 662 cc. LMPP 101 cm H2O at 699 cc. Stable CMG. UPP 17 cm H2O. Report from technician that the UPP was difficult to perform.  Pressure flow study - Pdet max 10 cm H2O. Voided 873 cc.  Denies fecal incontinence or constipation with splinting.   Stopped Diclofenac which was for fibromyalgia and arthritis pain.  Sees Dr. Trudie Reed for this.   Just started Zetia for elevated cholesterol.  PCP - Crist Infante, MD  GYNECOLOGIC HISTORY: No LMP recorded. Patient has had a hysterectomy. Contraception: hysterectomy Menopausal hormone therapy: Ortho-Est in past, but none currently Last mammogram: 09/02/15, 3D, Heterogeneously dense breast tissue; Bi-Rads 1: Negative Last pap smear: 05/08/09, Negative    OB History    Gravida Para Term Preterm AB TAB SAB Ectopic Multiple Living   1 1 1       1        Patient Active Problem List   Diagnosis Date Noted  . Osteoporosis, unspecified 06/04/2014    Class: History of    Past Medical History  Diagnosis Date  . 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 Allergy   . Fibromyalgia   . Endometriosis   . Osteoporosis   . Osteoporosis, unspecified 06/04/2014    PCP management with  medication    Past Surgical History  Procedure Laterality Date  . Stapedes surgery      left ear  . Appendectomy    . Total abdominal hysterectomy  1980's  . Appendectomy      Current Outpatient Prescriptions  Medication Sig Dispense Refill  . CALCIUM PO Take by mouth daily. + d    . Cholecalciferol (VITAMIN D PO) Take by mouth daily.    Marland Kitchen CINNAMON PO Take by mouth as needed.    . gabapentin (NEURONTIN) 300 MG capsule Take 300 mg by mouth 3 (three) times daily.    Marland Kitchen ibandronate (BONIVA) 150 MG tablet Take 150 mg by mouth every 30 (thirty) days. Take in the morning with a full glass of water, on an empty stomach, and do not take anything else by mouth or lie down for the next 30 min.    . Multiple Vitamin (MULTIVITAMIN) tablet Take 1 tablet by mouth daily.    . Omega-3 Fatty Acids (FISH OIL) 500 MG CAPS Take by mouth.    . oxybutynin (OXYTROL) 3.9 MG/24HR Place 1 patch onto the skin every 3 (three) days.    Marland Kitchen triamcinolone (NASACORT AQ) 55 MCG/ACT AERO nasal inhaler Place 2 sprays into the nose daily.    Marland Kitchen ZETIA 10 MG tablet Take 1 tablet by mouth daily.  11   No current facility-administered medications for this visit.     ALLERGIES: Amoxicillin and Eggs or egg-derived products  Family History  Problem Relation Age of Onset  . Heart  disease Father   . Stroke Father   . Hypertension Father   . Alzheimer's disease Mother   . Osteoporosis Mother     Social History   Social History  . Marital Status: Married    Spouse Name: N/A  . Number of Children: N/A  . Years of Education: N/A   Occupational History  . Not on file.   Social History Main Topics  . Smoking status: Never Smoker   . Smokeless tobacco: Never Used  . Alcohol Use: No  . Drug Use: No  . Sexual Activity:    Partners: Male    Birth Control/  Protection: Surgical     Comment: TAH   Other Topics Concern  . Not on file   Social History Narrative    ROS: Pertinent items are noted in HPI.  PHYSICAL EXAMINATION:   Ht 5\' 4"  (1.626 m)  General appearance: alert, cooperative and appears stated age. Has a resting tremor of the head. Head: Normocephalic, without obvious abnormality, atraumatic Neck: no adenopathy, supple, symmetrical, trachea midline and thyroid normal to inspection and palpation Lungs: clear to auscultation bilaterally  Heart: regular rate and rhythm Abdomen: soft, non-tender; bowel sounds normal; no masses, no organomegaly Extremities: extremities normal, atraumatic, no cyanosis or edema Skin: Skin color, texture, turgor normal. No rashes or lesions Lymph nodes: Cervical, supraclavicular, and axillary nodes normal. No abnormal inguinal nodes palpated Neurologic: Grossly normal  Pelvic: External genitalia: no lesions  Urethra: normal appearing urethra with no masses, tenderness or lesions  Bartholins and Skenes: normal   Vagina: normal appearing vagina with normal color and discharge, no lesions. Second degree cystocele and first degree rectocele. Good apical support.  Cervix: absent   Bimanual Exam: Uterus: uterus absent  Adnexa: normal adnexa and no mass, fullness, tenderness  Rectovaginal: Yes. . Confirms.  Anus: normal sphincter tone, Hemorrhoid noted.  Chaperone was present for exam.  ASSESSMENT  Status post TAH. Ovaries remain.  Cystocele.  Rectocele.  Genuine stress incontinence - occult. Urinary frequency and nocturia treated with Oxytrol.  PLAN  Surgical care will be an anterior and posterior colporrhaphy with native tissue repair, TVT Exact midurethral sling and cystoscopy.  Patient is aware that the TVT Exact uses a permanent mesh. We have discussed  mesh issues of exposures and erosions that can lead to pain, complications, and further surgical care. Risks, benefits, and alternatives to surgery discussed, and the patient wishes to proceed. Surgical expectations and recovery discussed. Stop Oxytrol 5 days prior to surgery.   An After Visit Summary was printed and given to the patient.  __25____ minutes face to face time of which over 50% was spent in counseling.

## 2016-02-17 ENCOUNTER — Ambulatory Visit (HOSPITAL_COMMUNITY): Payer: Medicare Other | Admitting: Anesthesiology

## 2016-02-17 ENCOUNTER — Encounter (HOSPITAL_COMMUNITY): Payer: Self-pay | Admitting: Anesthesiology

## 2016-02-17 ENCOUNTER — Encounter (HOSPITAL_COMMUNITY)
Admission: RE | Disposition: A | Discharge status: Home / Self Care | Payer: Self-pay | Source: Ambulatory Visit | Attending: Obstetrics and Gynecology

## 2016-02-17 ENCOUNTER — Observation Stay (HOSPITAL_COMMUNITY)
Admission: RE | Admit: 2016-02-17 | Discharge: 2016-02-18 | Disposition: A | Discharge status: Home / Self Care | Payer: Medicare Other | Source: Ambulatory Visit | Attending: Obstetrics and Gynecology | Admitting: Obstetrics and Gynecology

## 2016-02-17 DIAGNOSIS — N816 Rectocele: Secondary | ICD-10-CM | POA: Diagnosis not present

## 2016-02-17 DIAGNOSIS — M810 Age-related osteoporosis without current pathological fracture: Secondary | ICD-10-CM | POA: Insufficient documentation

## 2016-02-17 DIAGNOSIS — K469 Unspecified abdominal hernia without obstruction or gangrene: Secondary | ICD-10-CM | POA: Insufficient documentation

## 2016-02-17 DIAGNOSIS — Z91012 Allergy to eggs: Secondary | ICD-10-CM | POA: Diagnosis not present

## 2016-02-17 DIAGNOSIS — N393 Stress incontinence (female) (male): Secondary | ICD-10-CM | POA: Diagnosis not present

## 2016-02-17 DIAGNOSIS — Z881 Allergy status to other antibiotic agents status: Secondary | ICD-10-CM | POA: Insufficient documentation

## 2016-02-17 DIAGNOSIS — M797 Fibromyalgia: Secondary | ICD-10-CM | POA: Diagnosis not present

## 2016-02-17 DIAGNOSIS — N8110 Cystocele, unspecified: Secondary | ICD-10-CM | POA: Diagnosis not present

## 2016-02-17 DIAGNOSIS — Z9889 Other specified postprocedural states: Secondary | ICD-10-CM

## 2016-02-17 DIAGNOSIS — Z79899 Other long term (current) drug therapy: Secondary | ICD-10-CM | POA: Diagnosis not present

## 2016-02-17 DIAGNOSIS — Z90710 Acquired absence of both cervix and uterus: Secondary | ICD-10-CM | POA: Diagnosis not present

## 2016-02-17 HISTORY — PX: CYSTO: SHX6284

## 2016-02-17 HISTORY — PX: BLADDER SUSPENSION: SHX72

## 2016-02-17 HISTORY — PX: ANTERIOR AND POSTERIOR REPAIR: SHX5121

## 2016-02-17 SURGERY — ANTERIOR (CYSTOCELE) AND POSTERIOR REPAIR (RECTOCELE)
Anesthesia: General

## 2016-02-17 MED ORDER — DEXAMETHASONE SODIUM PHOSPHATE 10 MG/ML IJ SOLN
INTRAMUSCULAR | Status: DC | PRN
  Administered 2016-02-17: 4 mg via INTRAVENOUS

## 2016-02-17 MED ORDER — OXYCODONE HCL 5 MG/5ML PO SOLN: 5.0000 mg | Freq: Once | ORAL | Status: DC | PRN

## 2016-02-17 MED ORDER — EZETIMIBE 10 MG PO TABS
10.0000 mg | ORAL_TABLET | Freq: Every day | ORAL | Status: DC
  Filled 2016-02-17 (×3): qty 1

## 2016-02-17 MED ORDER — TRIAMCINOLONE ACETONIDE 55 MCG/ACT NA AERO
2.0000 | INHALATION_SPRAY | Freq: Every day | NASAL | Status: DC
  Filled 2016-02-17: qty 21.6

## 2016-02-17 MED ORDER — SODIUM CHLORIDE 0.9 % IJ SOLN
INTRAMUSCULAR | Status: AC
  Filled 2016-02-17: qty 10

## 2016-02-17 MED ORDER — PROPOFOL 10 MG/ML IV BOLUS
INTRAVENOUS | Status: DC | PRN
  Administered 2016-02-17: 150 mg via INTRAVENOUS
  Administered 2016-02-17: 20 mg via INTRAVENOUS
  Administered 2016-02-17: 30 mg via INTRAVENOUS

## 2016-02-17 MED ORDER — GLYCOPYRROLATE 0.2 MG/ML IJ SOLN
INTRAMUSCULAR | Status: AC
  Filled 2016-02-17: qty 1

## 2016-02-17 MED ORDER — OXYCODONE HCL 5 MG PO TABS: 5.0000 mg | ORAL_TABLET | Freq: Once | ORAL | Status: DC | PRN

## 2016-02-17 MED ORDER — SUGAMMADEX SODIUM 200 MG/2ML IV SOLN
INTRAVENOUS | Status: DC | PRN
  Administered 2016-02-17: 133 mg via INTRAVENOUS

## 2016-02-17 MED ORDER — ONDANSETRON HCL 4 MG/2ML IJ SOLN: 4.0000 mg | Freq: Once | INTRAMUSCULAR | Status: DC | PRN

## 2016-02-17 MED ORDER — ONDANSETRON HCL 4 MG/2ML IJ SOLN
INTRAMUSCULAR | Status: AC
  Filled 2016-02-17: qty 2

## 2016-02-17 MED ORDER — DIPHENHYDRAMINE HCL 50 MG/ML IJ SOLN: 12.5000 mg | Freq: Four times a day (QID) | INTRAMUSCULAR | Status: DC | PRN

## 2016-02-17 MED ORDER — DIPHENHYDRAMINE HCL 50 MG/ML IJ SOLN
INTRAMUSCULAR | Status: AC
  Filled 2016-02-17: qty 1

## 2016-02-17 MED ORDER — LIDOCAINE-EPINEPHRINE 1 %-1:100000 IJ SOLN
INTRAMUSCULAR | Status: DC | PRN
  Administered 2016-02-17: 17 mL

## 2016-02-17 MED ORDER — STERILE WATER FOR IRRIGATION IR SOLN
Status: DC | PRN
  Administered 2016-02-17: 1000 mL

## 2016-02-17 MED ORDER — KETOROLAC TROMETHAMINE 30 MG/ML IJ SOLN
INTRAMUSCULAR | Status: AC
  Filled 2016-02-17: qty 1

## 2016-02-17 MED ORDER — EPHEDRINE SULFATE 50 MG/ML IJ SOLN
INTRAMUSCULAR | Status: DC | PRN
  Administered 2016-02-17: 5 mg via INTRAVENOUS
  Administered 2016-02-17: 10 mg via INTRAVENOUS
  Administered 2016-02-17: 15 mg via INTRAVENOUS
  Administered 2016-02-17: 10 mg via INTRAVENOUS
  Administered 2016-02-17: 5 mg via INTRAVENOUS

## 2016-02-17 MED ORDER — KETOROLAC TROMETHAMINE 30 MG/ML IJ SOLN
INTRAMUSCULAR | Status: DC | PRN
  Administered 2016-02-17: 15 mg via INTRAVENOUS

## 2016-02-17 MED ORDER — ROCURONIUM BROMIDE 100 MG/10ML IV SOLN
INTRAVENOUS | Status: AC
  Filled 2016-02-17: qty 1

## 2016-02-17 MED ORDER — ROCURONIUM BROMIDE 100 MG/10ML IV SOLN
INTRAVENOUS | Status: DC | PRN
  Administered 2016-02-17: 35 mg via INTRAVENOUS

## 2016-02-17 MED ORDER — LACTATED RINGERS IV SOLN
INTRAVENOUS | Status: DC
  Administered 2016-02-17 (×2): via INTRAVENOUS

## 2016-02-17 MED ORDER — MENTHOL 3 MG MT LOZG: 1.0000 | LOZENGE | OROMUCOSAL | Status: DC | PRN

## 2016-02-17 MED ORDER — GLYCOPYRROLATE 0.2 MG/ML IJ SOLN
INTRAMUSCULAR | Status: DC | PRN
  Administered 2016-02-17: 0.2 mg via INTRAVENOUS

## 2016-02-17 MED ORDER — SCOPOLAMINE 1 MG/3DAYS TD PT72
MEDICATED_PATCH | TRANSDERMAL | Status: AC
  Administered 2016-02-17: 1.5 mg via TRANSDERMAL
  Filled 2016-02-17: qty 1

## 2016-02-17 MED ORDER — SODIUM CHLORIDE 0.9% FLUSH: 9.0000 mL | INTRAVENOUS | Status: DC | PRN

## 2016-02-17 MED ORDER — PROPOFOL 10 MG/ML IV BOLUS
INTRAVENOUS | Status: AC
  Filled 2016-02-17: qty 20

## 2016-02-17 MED ORDER — ONDANSETRON HCL 4 MG/2ML IJ SOLN: 4.0000 mg | Freq: Four times a day (QID) | INTRAMUSCULAR | Status: DC | PRN

## 2016-02-17 MED ORDER — FENTANYL CITRATE (PF) 250 MCG/5ML IJ SOLN
INTRAMUSCULAR | Status: AC
  Filled 2016-02-17: qty 5

## 2016-02-17 MED ORDER — ONDANSETRON HCL 4 MG PO TABS: 4.0000 mg | ORAL_TABLET | Freq: Four times a day (QID) | ORAL | Status: DC | PRN

## 2016-02-17 MED ORDER — OXYCODONE-ACETAMINOPHEN 5-325 MG PO TABS
1.0000 | ORAL_TABLET | ORAL | Status: DC | PRN
  Filled 2016-02-17: qty 1

## 2016-02-17 MED ORDER — DIPHENHYDRAMINE HCL 12.5 MG/5ML PO ELIX: 12.5000 mg | ORAL_SOLUTION | Freq: Four times a day (QID) | ORAL | Status: DC | PRN

## 2016-02-17 MED ORDER — DEXAMETHASONE SODIUM PHOSPHATE 4 MG/ML IJ SOLN
INTRAMUSCULAR | Status: AC
  Filled 2016-02-17: qty 1

## 2016-02-17 MED ORDER — ESTRADIOL 0.1 MG/GM VA CREA
TOPICAL_CREAM | VAGINAL | Status: DC | PRN
  Administered 2016-02-17: 1 via VAGINAL

## 2016-02-17 MED ORDER — LIDOCAINE HCL (CARDIAC) 20 MG/ML IV SOLN
INTRAVENOUS | Status: DC | PRN
  Administered 2016-02-17: 60 mg via INTRAVENOUS

## 2016-02-17 MED ORDER — GABAPENTIN 300 MG PO CAPS
300.0000 mg | ORAL_CAPSULE | Freq: Three times a day (TID) | ORAL | Status: DC
  Administered 2016-02-17 – 2016-02-18 (×2): 300 mg via ORAL
  Filled 2016-02-17 (×6): qty 1

## 2016-02-17 MED ORDER — MORPHINE SULFATE 2 MG/ML IV SOLN
INTRAVENOUS | Status: DC
  Administered 2016-02-17: 0 mg via INTRAVENOUS
  Administered 2016-02-17: 2 mg via INTRAVENOUS
  Administered 2016-02-17: 12:00:00 via INTRAVENOUS
  Filled 2016-02-17: qty 25

## 2016-02-17 MED ORDER — LACTATED RINGERS IV SOLN: INTRAVENOUS | Status: DC

## 2016-02-17 MED ORDER — IBUPROFEN 600 MG PO TABS
600.0000 mg | ORAL_TABLET | Freq: Four times a day (QID) | ORAL | Status: DC | PRN
  Administered 2016-02-18 (×2): 600 mg via ORAL
  Filled 2016-02-17 (×2): qty 1

## 2016-02-17 MED ORDER — SUGAMMADEX SODIUM 200 MG/2ML IV SOLN
INTRAVENOUS | Status: AC
  Filled 2016-02-17: qty 4

## 2016-02-17 MED ORDER — PHENYLEPHRINE 40 MCG/ML (10ML) SYRINGE FOR IV PUSH (FOR BLOOD PRESSURE SUPPORT)
PREFILLED_SYRINGE | INTRAVENOUS | Status: AC
  Filled 2016-02-17: qty 10

## 2016-02-17 MED ORDER — LACTATED RINGERS IV SOLN
INTRAVENOUS | Status: DC
  Administered 2016-02-17 – 2016-02-18 (×3): via INTRAVENOUS

## 2016-02-17 MED ORDER — EPHEDRINE 5 MG/ML INJ
INTRAVENOUS | Status: AC
  Filled 2016-02-17: qty 10

## 2016-02-17 MED ORDER — LIDOCAINE HCL (CARDIAC) 20 MG/ML IV SOLN
INTRAVENOUS | Status: AC
  Filled 2016-02-17: qty 5

## 2016-02-17 MED ORDER — LIDOCAINE-EPINEPHRINE 1 %-1:100000 IJ SOLN
INTRAMUSCULAR | Status: AC
  Filled 2016-02-17: qty 1

## 2016-02-17 MED ORDER — ESTRADIOL 0.1 MG/GM VA CREA
TOPICAL_CREAM | VAGINAL | Status: AC
  Filled 2016-02-17: qty 42.5

## 2016-02-17 MED ORDER — PHENYLEPHRINE HCL 10 MG/ML IJ SOLN
INTRAMUSCULAR | Status: DC | PRN
  Administered 2016-02-17: 80 ug via INTRAVENOUS
  Administered 2016-02-17 (×2): 50 ug via INTRAVENOUS

## 2016-02-17 MED ORDER — METHYLENE BLUE 1 % INJ SOLN
INTRAMUSCULAR | Status: AC
  Filled 2016-02-17: qty 1

## 2016-02-17 MED ORDER — KETOROLAC TROMETHAMINE 15 MG/ML IJ SOLN
15.0000 mg | Freq: Four times a day (QID) | INTRAMUSCULAR | Status: DC
  Administered 2016-02-17: 15 mg via INTRAVENOUS
  Filled 2016-02-17 (×5): qty 1

## 2016-02-17 MED ORDER — SCOPOLAMINE 1 MG/3DAYS TD PT72
1.0000 | MEDICATED_PATCH | Freq: Once | TRANSDERMAL | Status: DC
  Administered 2016-02-17: 1.5 mg via TRANSDERMAL

## 2016-02-17 MED ORDER — ONDANSETRON HCL 4 MG/2ML IJ SOLN
INTRAMUSCULAR | Status: DC | PRN
Start: 2016-02-17 — End: 2016-02-17
  Administered 2016-02-17: 4 mg via INTRAVENOUS

## 2016-02-17 MED ORDER — NALOXONE HCL 0.4 MG/ML IJ SOLN: 0.4000 mg | INTRAMUSCULAR | Status: DC | PRN

## 2016-02-17 MED ORDER — FENTANYL CITRATE (PF) 100 MCG/2ML IJ SOLN
INTRAMUSCULAR | Status: DC | PRN
  Administered 2016-02-17 (×2): 25 ug via INTRAVENOUS
  Administered 2016-02-17: 50 ug via INTRAVENOUS
  Administered 2016-02-17 (×2): 25 ug via INTRAVENOUS
  Administered 2016-02-17: 50 ug via INTRAVENOUS
  Administered 2016-02-17 (×2): 25 ug via INTRAVENOUS

## 2016-02-17 MED ORDER — FENTANYL CITRATE (PF) 100 MCG/2ML IJ SOLN: 25.0000 ug | INTRAMUSCULAR | Status: DC | PRN

## 2016-02-17 MED ORDER — MIDAZOLAM HCL 2 MG/2ML IJ SOLN
INTRAMUSCULAR | Status: AC
  Filled 2016-02-17: qty 2

## 2016-02-17 MED ORDER — DIPHENHYDRAMINE HCL 50 MG/ML IJ SOLN
INTRAMUSCULAR | Status: DC | PRN
  Administered 2016-02-17: 12.5 mg via INTRAVENOUS

## 2016-02-17 SURGICAL SUPPLY — 46 items
BLADE SURG 11 STRL SS (BLADE) ×2 IMPLANT
CANISTER SUCT 3000ML (MISCELLANEOUS) ×2 IMPLANT
CATH FOLEY 2WAY SLVR  5CC 18FR (CATHETERS) ×1
CATH FOLEY 2WAY SLVR 5CC 18FR (CATHETERS) ×1 IMPLANT
CLOTH BEACON ORANGE TIMEOUT ST (SAFETY) ×2 IMPLANT
CONT PATH 16OZ SNAP LID 3702 (MISCELLANEOUS) IMPLANT
COVER MAYO STAND STRL (DRAPES) ×1 IMPLANT
DECANTER SPIKE VIAL GLASS SM (MISCELLANEOUS) IMPLANT
DEVICE CAPIO SLIM SINGLE (INSTRUMENTS) IMPLANT
GAUZE PACKING 2X5 YD STRL (GAUZE/BANDAGES/DRESSINGS) ×2 IMPLANT
GAUZE SPONGE 4X4 16PLY XRAY LF (GAUZE/BANDAGES/DRESSINGS) ×2 IMPLANT
GLOVE BIO SURGEON STRL SZ 6.5 (GLOVE) ×2 IMPLANT
GLOVE BIOGEL PI IND STRL 6.5 (GLOVE) ×1 IMPLANT
GLOVE BIOGEL PI IND STRL 7.0 (GLOVE) ×1 IMPLANT
GLOVE BIOGEL PI INDICATOR 6.5 (GLOVE) ×1
GLOVE BIOGEL PI INDICATOR 7.0 (GLOVE) ×1
GOWN STRL REUS W/TWL LRG LVL3 (GOWN DISPOSABLE) ×8 IMPLANT
LIQUID BAND (GAUZE/BANDAGES/DRESSINGS) ×2 IMPLANT
MATRIX HEMOSTAT SURGIFLO (HEMOSTASIS) ×1 IMPLANT
NDL MAYO 6 CRC TAPER PT (NEEDLE) IMPLANT
NEEDLE HYPO 22GX1.5 SAFETY (NEEDLE) ×2 IMPLANT
NEEDLE MAYO 6 CRC TAPER PT (NEEDLE) IMPLANT
NS IRRIG 1000ML POUR BTL (IV SOLUTION) ×2 IMPLANT
PACK VAGINAL WOMENS (CUSTOM PROCEDURE TRAY) ×2 IMPLANT
PAD MAGNETIC INST (MISCELLANEOUS) ×2 IMPLANT
PLUG CATH AND CAP STER (CATHETERS) ×2 IMPLANT
RETRACTOR STAY HOOK 5MM (MISCELLANEOUS) ×1 IMPLANT
SET CYSTO W/LG BORE CLAMP LF (SET/KITS/TRAYS/PACK) ×2 IMPLANT
SLING TVT EXACT (Sling) ×1 IMPLANT
SPONGE SURGIFOAM ABS GEL 12-7 (HEMOSTASIS) IMPLANT
SURGIFLO TRUKIT (HEMOSTASIS) IMPLANT
SUT CAPIO ETHIBPND (SUTURE) IMPLANT
SUT VIC AB 0 CT1 18XCR BRD8 (SUTURE) IMPLANT
SUT VIC AB 0 CT1 27 (SUTURE) ×6
SUT VIC AB 0 CT1 27XBRD ANBCTR (SUTURE) ×3 IMPLANT
SUT VIC AB 0 CT1 8-18 (SUTURE) ×2
SUT VIC AB 2-0 CT1 27 (SUTURE)
SUT VIC AB 2-0 CT1 TAPERPNT 27 (SUTURE) IMPLANT
SUT VIC AB 2-0 CT2 27 (SUTURE) IMPLANT
SUT VIC AB 2-0 SH 27 (SUTURE) ×14
SUT VIC AB 2-0 SH 27XBRD (SUTURE) ×4 IMPLANT
SUT VIC AB 2-0 UR6 27 (SUTURE) IMPLANT
TOWEL OR 17X24 6PK STRL BLUE (TOWEL DISPOSABLE) ×4 IMPLANT
TRAY FOLEY BAG SILVER LF 16FR (SET/KITS/TRAYS/PACK) ×2 IMPLANT
TUBING NON-CON 1/4 X 20 CONN (TUBING) ×2 IMPLANT
WATER STERILE IRR 1000ML POUR (IV SOLUTION) ×2 IMPLANT

## 2016-02-17 NOTE — Anesthesia Procedure Notes (Signed)
Procedure Name: Intubation Date/Time: 02/17/2016 7:26 AM Performed by: Tobin Chad Pre-anesthesia Checklist: Patient identified, Emergency Drugs available, Suction available and Patient being monitored Patient Re-evaluated:Patient Re-evaluated prior to inductionOxygen Delivery Method: Circle system utilized and Simple face mask Preoxygenation: Pre-oxygenation with 100% oxygen Intubation Type: IV induction and Inhalational induction Ventilation: Mask ventilation without difficulty Laryngoscope Size: Mac and 3 Grade View: Grade II Tube type: Oral Tube size: 7.0 mm Number of attempts: 1 Airway Equipment and Method: Stylet Placement Confirmation: ETT inserted through vocal cords under direct vision,  positive ETCO2 and breath sounds checked- equal and bilateral Secured at: 21 cm Tube secured with: Tape Dental Injury: Teeth and Oropharynx as per pre-operative assessment

## 2016-02-17 NOTE — Anesthesia Postprocedure Evaluation (Signed)
Anesthesia Post Note  Patient: [name]  Procedure(s) Performed: Procedure(s) (LRB): ANTERIOR (CYSTOCELE) AND POSTERIOR REPAIR (RECTOCELE); Enterocele repair and lysis of adhesions (N/A) TRANSVAGINAL TAPE (TVT) PROCEDURE exact midurethral sling (N/A) CYSTO (N/A)  Patient location during evaluation: PACU Anesthesia Type: General Level of consciousness: awake, awake and alert and oriented Pain management: pain level controlled Vital Signs Assessment: post-procedure vital signs reviewed and stable Respiratory status: spontaneous breathing, nonlabored ventilation and respiratory function stable Cardiovascular status: blood pressure returned to baseline Anesthetic complications: no    Last Vitals:  Filed Vitals:   02/17/16 1200 02/17/16 1300  BP: 112/52 119/51  Pulse: 85 64  Temp: 36.4 C 36.4 C  Resp: 16 16    Last Pain:  Filed Vitals:   02/17/16 1313  PainSc: 3                  Kimberlye Dilger COKER

## 2016-02-17 NOTE — Transfer of Care (Signed)
Immediate Anesthesia Transfer of Care Note  Patient: [name]  Procedure(s) Performed: Procedure(s): ANTERIOR (CYSTOCELE) AND POSTERIOR REPAIR (RECTOCELE); Enterocele repair and lysis of adhesions (N/A) TRANSVAGINAL TAPE (TVT) PROCEDURE exact midurethral sling (N/A) CYSTO (N/A)  Patient Location: PACU  Anesthesia Type:General  Level of Consciousness: sedated and patient cooperative  Airway & Oxygen Therapy: Patient Spontanous Breathing and Patient connected to nasal cannula oxygen  Post-op Assessment: Report given to RN and Post -op Vital signs reviewed and stable  Post vital signs: Reviewed and stable  Last Vitals:  Filed Vitals:   02/17/16 0604  BP: 147/86  Pulse: 76  Temp: 36.7 C  Resp: 20    Complications: No apparent anesthesia complications

## 2016-02-17 NOTE — Progress Notes (Signed)
No marked change in status since office preop visit.  Patient examined.  OK to proceed.

## 2016-02-17 NOTE — Brief Op Note (Signed)
02/17/2016  10:34 AM  PATIENT:  [name]  70 y.o. female  PRE-OPERATIVE DIAGNOSIS:  cystocele, rectocele,genuine SUI  POST-OPERATIVE DIAGNOSIS:  cystocele, rectocele,enterocele, cul de sac adhesions, genuine SUI  PROCEDURE:  Procedure(s): ANTERIOR (CYSTOCELE) AND POSTERIOR REPAIR (RECTOCELE); Enterocele repair and lysis of adhesions (N/A) TRANSVAGINAL TAPE (TVT) PROCEDURE exact midurethral sling (N/A) CYSTO (N/A)  SURGEON:  Surgeon(s) and Role:    * Brook E Yisroel Ramming, MD - Primary    * Salvadore Dom, MD - Assisting  PHYSICIAN ASSISTANT: NA  ASSISTANTS: Salvadore Dom, MD - Assisting   ANESTHESIA:   local and general  EBL:  Total I/O In: 1500 [I.V.:1500] Out: 1100 [Urine:900; Blood:200]  BLOOD ADMINISTERED:none  DRAINS: Urinary Catheter (Foley)   LOCAL MEDICATIONS USED:  LIDOCAINE   SPECIMEN:  No Specimen  DISPOSITION OF SPECIMEN:  N/A  COUNTS:  YES  TOURNIQUET:  * No tourniquets in log *  DICTATION: .Other Dictation: Dictation Number    PLAN OF CARE: Admit for overnight observation  PATIENT DISPOSITION:  PACU - hemodynamically stable.   Delay start of Pharmacological VTE agent (>24hrs) due to surgical blood loss or risk of bleeding: not applicable

## 2016-02-17 NOTE — Anesthesia Preprocedure Evaluation (Signed)
Anesthesia Evaluation  Patient identified by MRN, date of birth, ID band Patient awake    Reviewed: Allergy & Precautions, NPO status , Patient's Chart, lab work & pertinent test results  Airway Mallampati: II  TM Distance: >3 FB     Dental  (+) Teeth Intact, Dental Advisory Given   Pulmonary    breath sounds clear to auscultation       Cardiovascular  Rhythm:Regular Rate:Normal     Neuro/Psych    GI/Hepatic   Endo/Other    Renal/GU      Musculoskeletal   Abdominal   Peds  Hematology   Anesthesia Other Findings   Reproductive/Obstetrics                             Anesthesia Physical Anesthesia Plan  ASA: II  Anesthesia Plan: General   Post-op Pain Management:    Induction: Intravenous  Airway Management Planned: LMA  Additional Equipment:   Intra-op Plan:   Post-operative Plan: Extubation in OR  Informed Consent: I have reviewed the patients History and Physical, chart, labs and discussed the procedure including the risks, benefits and alternatives for the proposed anesthesia with the patient or authorized representative who has indicated his/her understanding and acceptance.   Dental advisory given  Plan Discussed with: CRNA and Anesthesiologist  Anesthesia Plan Comments:         Anesthesia Quick Evaluation

## 2016-02-17 NOTE — Progress Notes (Signed)
Day of Surgery Procedure(s) (LRB): ANTERIOR (CYSTOCELE) AND POSTERIOR REPAIR (RECTOCELE); Enterocele repair and lysis of adhesions (N/A) TRANSVAGINAL TAPE (TVT) PROCEDURE exact midurethral sling (N/A) CYSTO (N/A)  Subjective: Patient reports tolerating PO.   Not very hungry.   Not out of bed yet.   Objective: I have reviewed patient's vital signs and intake and output. T  Now 97.7, BP 119/52, P 74, RR 16. I/O - 1890 cc/1800 cc UO  General: alert and cooperative Resp: clear to auscultation bilaterally Cardio: regular rate and rhythm, S1, S2 normal, no murmur, click, rub or gallop GI: soft, non-tender; bowel sounds normal; no masses,  no organomegaly and incision: clean, dry and intact Extremities: PAS and Ted hose on.  DPs 2+ bilaterally. Vaginal Bleeding: minimal  Assessment: s/p Procedure(s): ANTERIOR (CYSTOCELE) AND POSTERIOR REPAIR (RECTOCELE); Enterocele repair and lysis of adhesions (N/A) TRANSVAGINAL TAPE (TVT) PROCEDURE exact midurethral sling (N/A) CYSTO (N/A): stable  Plan: Advance diet PCA morhpine and Toradol.  May take oral pain meds if desires to switch to po pain meds.  Surgical findings and procedure discussed including bladder mass for which she will need to see urology as outpatient.  CBC and BMP in the am.  Bladder training in the am. Vaginal packing out in the am.   LOS: 0 days    Arloa Koh 02/17/2016, 3:58 PM

## 2016-02-18 ENCOUNTER — Other Ambulatory Visit: Payer: Self-pay | Admitting: Obstetrics and Gynecology

## 2016-02-18 ENCOUNTER — Encounter (HOSPITAL_COMMUNITY): Payer: Self-pay | Admitting: Obstetrics and Gynecology

## 2016-02-18 DIAGNOSIS — N8110 Cystocele, unspecified: Secondary | ICD-10-CM | POA: Diagnosis not present

## 2016-02-18 LAB — CBC
HCT: 31.3 % — ABNORMAL LOW (ref 36.0–46.0)
Hemoglobin: 10.4 g/dL — ABNORMAL LOW (ref 12.0–15.0)
MCH: 31.3 pg (ref 26.0–34.0)
MCHC: 33.2 g/dL (ref 30.0–36.0)
MCV: 94.3 fL (ref 78.0–100.0)
Platelets: 195 10*3/uL (ref 150–400)
RBC: 3.32 MIL/uL — ABNORMAL LOW (ref 3.87–5.11)
RDW: 14.1 % (ref 11.5–15.5)
WBC: 9.7 10*3/uL (ref 4.0–10.5)

## 2016-02-18 LAB — BASIC METABOLIC PANEL
Anion gap: 5 (ref 5–15)
BUN: 8 mg/dL (ref 6–20)
CO2: 30 mmol/L (ref 22–32)
Calcium: 8.8 mg/dL — ABNORMAL LOW (ref 8.9–10.3)
Chloride: 107 mmol/L (ref 101–111)
Creatinine, Ser: 0.57 mg/dL (ref 0.44–1.00)
GFR calc Af Amer: >60 mL/min (ref >60)
GFR calc non Af Amer: >60 mL/min (ref >60)
Glucose, Bld: 98 mg/dL (ref 65–99)
Potassium: 3.7 mmol/L (ref 3.5–5.1)
Sodium: 142 mmol/L (ref 135–145)

## 2016-02-18 MED ORDER — IBUPROFEN 600 MG PO TABS: 600.0000 mg | ORAL_TABLET | Freq: Four times a day (QID) | ORAL | Status: DC | PRN

## 2016-02-18 MED ORDER — CIPROFLOXACIN HCL 250 MG PO TABS: 250.0000 mg | ORAL_TABLET | Freq: Two times a day (BID) | ORAL | Status: DC

## 2016-02-18 MED ORDER — HYDROCODONE-ACETAMINOPHEN 5-300 MG PO TABS: 1.0000 | ORAL_TABLET | ORAL | Status: DC | PRN

## 2016-02-18 NOTE — Progress Notes (Signed)
1 Day Post-Op Procedure(s) (LRB): ANTERIOR (CYSTOCELE) AND POSTERIOR REPAIR (RECTOCELE); Enterocele repair and lysis of adhesions (N/A) TRANSVAGINAL TAPE (TVT) PROCEDURE exact midurethral sling (N/A) CYSTO (N/A)  Subjective: Patient reports incisional pain, tolerating PO and + flatus.    Objective: I have reviewed patient's vital signs, intake and output and labs. T 97.7, BP 108/51, P 68, RR 16. I/O - 4220 cc IV/2725 cc UO Hgb 10.4.  General: alert and cooperative Resp: clear to auscultation bilaterally Cardio: regular rate and rhythm, S1, S2 normal, no murmur, click, rub or gallop GI: soft, non-tender; bowel sounds normal; no masses,  no organomegaly and incision: clean, dry and intact Extremities: Ted hose on. Vaginal Bleeding: minimal and  no active bleeding noted.  Assessment: s/p Procedure(s): ANTERIOR (CYSTOCELE) AND POSTERIOR REPAIR (RECTOCELE); Enterocele repair and lysis of adhesions (N/A) TRANSVAGINAL TAPE (TVT) PROCEDURE exact midurethral sling (N/A) CYSTO (N/A): progressing well  Plan: Encourage ambulation Advance to PO medication Discharge home  Instructions and precautions given.  Follow up in 5 days.  Vicodin and Motrin for pain.    LOS: 1 day    [name] 02/18/2016, 8:03 AM

## 2016-02-18 NOTE — Anesthesia Postprocedure Evaluation (Signed)
Anesthesia Post Note  Patient: LARSEN ARAMBURU  Procedure(s) Performed: Procedure(s) (LRB): ANTERIOR (CYSTOCELE) AND POSTERIOR REPAIR (RECTOCELE); Enterocele repair and lysis of adhesions (N/A) TRANSVAGINAL TAPE (TVT) PROCEDURE exact midurethral sling (N/A) CYSTO (N/A)  Patient location during evaluation: [facility] Anesthesia Type: General Level of consciousness: awake, awake and alert, oriented and patient cooperative Pain management: pain level controlled Vital Signs Assessment: post-procedure vital signs reviewed and stable Respiratory status: spontaneous breathing and respiratory function stable Cardiovascular status: blood pressure returned to baseline and stable Postop Assessment: no headache, no backache and adequate PO intake Anesthetic complications: no Comments: Pain level 2-3    Last Vitals:  Filed Vitals:   02/18/16 0458 02/18/16 1026  BP: 108/51 115/51  Pulse: 68 59  Temp: 36.5 C 36.6 C  Resp: 16 16    Last Pain:  Filed Vitals:   02/18/16 1026  PainSc: 2                  Rahima Fleishman

## 2016-02-18 NOTE — Discharge Instructions (Signed)
Anterior and Posterior Colporrhaphy, Sling Procedure, Care After Refer to this sheet in the next few weeks. These instructions provide you with information on caring for yourself after your procedure. Your health care provider may also give you more specific instructions. Your treatment has been planned according to current medical practices, but problems sometimes occur. Call your health care provider if you have any problems or questions after your procedure.  HOME CARE INSTRUCTIONS  Rest as much as possible during the first 2 weeks after the procedure.   Avoid heavy lifting (more than 10 pounds [4.5 kg]), pushing, or pulling. Limit stair climbing to once or twice a day the first week, then slowly increase this activity.   Avoid standing for prolonged periods of time.   Talk with your health care provider about when you may resume your usual physical activity.   You may resume your normal diet right away.   Drink at least 6-8 glasses of non-caffeinated beverages per day.   Eat a well-balanced diet. Daily portions of food from the meat (protein), milk, fruit, vegetable, and bread families are necessary for your health.   Your normal bowel function should return. If you become constipated, you may:   Take a mild laxative.  Add fruit and bran to your diet.  Drink more liquids.  You may take a shower and wash your hair.   Only take over-the-counter or prescription medicines as directed by your health care provider.   Clean the incision with water. Do not use a dressing unless the incision is draining or irritated. Check your incision daily for redness, draining, swelling, or separation of the skin.   Follow any bladder care instructions provided by your health care provider.   Keep your perineal area (the area between vagina and rectum) clean and dry. Perform perineal care after every bowel movement and each time you urinate. You may take a sitz bath or sit in a tub of  clean, warm water when necessary, unless your health care provider tells you otherwise.   Do not have sexual intercourse until permitted by your health care provider.   Follow up with your health care provider as directed.  SEEK MEDICAL CARE IF:  You have shaking chills.   Your pain is not relieved with medicine or becomes worse.  You have frequent or urgent urination, or you are unable to completely empty your bladder.   You feel a burning sensation when urinating.   You see pus coming from the wounds.  SEEK IMMEDIATE MEDICAL CARE IF:  You develop a fever.  You notice redness, drainage, swelling, or separation of the skin at the incision site.  You have difficulty breathing.  You are unable to urinate. MAKE SURE YOU:   Understand these instructions.  Will watch your condition.  Will get help right away if you are not doing well or get worse.   This information is not intended to replace advice given to you by your health care provider. Make sure you discuss any questions you have with your health care provider.   Document Released: 06/08/2004 Document Revised: 06/27/2013 Document Reviewed: 03/16/2013 Elsevier Interactive Patient Education [employer].

## 2016-02-18 NOTE — Progress Notes (Signed)
Pt verbalizes understanding of d/c instructions, medications, follow up appts, when to seek medical attention, & belongings policy. IV was d/c by NT prior to pt being d/c. I explained care of catheter with patient and how to change to a leg bag if needed. Pt seemed overwhelmed and stated that she will probably leave the standard bag on. I was able to give pt a copy of exit care notes on foley care. All questions were answered. Pt was escorted via wheelchair to the main entrance accompanied by NT and pts husband who will be driving her home. Marry Guan

## 2016-02-18 NOTE — Addendum Note (Signed)
Addendum  created 02/18/16 1042 by Alvy Bimler, CRNA   Modules edited: Clinical Notes   Clinical Notes:  File: [id number]

## 2016-02-18 NOTE — Progress Notes (Signed)
GYN Post op day one - Addendum note  Patient unable to void.  Foley catheter replaced.   Will send Rx to patient's pharmacy for Ciprofloxacin 250 gm po bid while catheter is on.  Has follow up appointment in 5 days.  Will remove catheter then.

## 2016-02-18 NOTE — Progress Notes (Signed)
Vaginal packing removed as ordered. Minimal amount of drainage noted, scant amount of drainage on peri pad. Patient tolerated well. Will continue to monitor.

## 2016-02-18 NOTE — Op Note (Signed)
[name], Sutton NO.:  [id number]  MEDICAL RECORD NO.:  [id number]  LOCATION:  9302                          FACILITY:  [facility]  PHYSICIAN:  Lenard Galloway, M.D.   DATE OF BIRTH:  1946-08-24  DATE OF PROCEDURE:  02/17/2016 DATE OF DISCHARGE:                              OPERATIVE REPORT   PREOPERATIVE DIAGNOSES:  Cystocele, rectocele, occult genuine stress incontinence.  POSTOPERATIVE DIAGNOSES:  Cystocele, rectocele, enterocele, cul-de-sac adhesions, occult genuine stress incontinence, bladder growth.  PROCEDURES:  Anterior and posterior colporrhaphy, enterocele repair with lysis of cul-de-sac adhesions, TVT Exact midurethral sling and cystoscopy.  SURGEON:  Lenard Galloway, M.D.  ASSISTANT:  Sumner Boast, M.D.  ANESTHESIA:  General endotracheal, local with 1% lidocaine with epinephrine, 1:100,000.  EBL:  200 mL.  URINE OUTPUT:  900 mL.  IV FLUIDS:  1500 mL Ringer's lactate.  COMPLICATIONS:  None.  INDICATIONS FOR THE PROCEDURE:  The patient is a 70 year old gravida 1, para 1 Caucasian female, status post total abdominal hysterectomy for endometriosis, who presents with pelvic organ prolapse.  On physical exam, the patient was noted to have a second-degree cystocele and a first-degree rectocele.  The patient was experiencing urinary frequency and incontinence.  The patient underwent multichannel urodynamic testing with reduction of the prolapse and she was diagnosed with occult genuine stress incontinence.  The patient is wishing for surgical repair and a plan is made to proceed now with an anterior and posterior colporrhaphy along with the TVT Exact midurethral sling and cystoscopy.  Risks, benefits, and alternatives are reviewed with the patient, who wishes to proceed.  FINDINGS:  Examination under anesthesia revealed a second-degree cystocele and a first-degree rectocele.  The cervix was surgically absent.  The apex had no significant  prolapse.  The bladder was visualized throughout 360 degrees.  There was a small growth noted in the midportion of the bladder trigone, which was whitish in color and measured approximately 4 mm in size.  There was no foreign body in the bladder or the urethra.  The ureters were noted to be patent bilaterally.  SPECIMENS:  None.  DESCRIPTION OF PROCEDURE:  The patient was reidentified in the preoperative hold area.  She received Ciprofloxacin and Flagyl for antibiotic prophylaxis, IV.  She received TED hose and PAS stockings for DVT prophylaxis.  The patient was transferred to the operating room where she was placed in the dorsal lithotomy position with Allen stirrups.  General endotracheal anesthesia was induced.  The patient's lower abdomen, vagina and perineum were then sterilely prepped and she was draped.  A Foley catheter was sterilely placed inside the bladder and left to gravity drainage throughout the procedure and at the termination of the procedure.  An examination under anesthesia was performed.  Allis clamps were used to mark the anterior vaginal wall from 1 cm below the urethra to the vaginal apex.  The anterior vaginal wall mucosa was injected locally with 1% lidocaine with epinephrine, 1:100,000.  The vaginal mucosa was then incised vertically in the midline with the scalpel.  With a combination of sharp and blunt dissection, the subvaginal tissue was dissected off the bladder bilaterally.  The dissection was carried back to  the pubic rami anteriorly.  At the vaginal apex, it was clear that there was an enterocele.  The enterocele was entered with dissection with the Metzenbaum scissors.  Adhesions of small bowel were encountered in the cul-de-sac region.  These adhesions were fairly extensive of the small bowel to this region.  There was evidence of an Ethibond suture from a prior surgical procedure.  A portion of the suture was removed; however, it could not be  removed entirety as it was very intimately in contact with the small bowel mucosa.  The enterocele sac was dissected out.  A pursestring suture of 2-0 Vicryl was then carefully placed and tied.  The anterior colporrhaphy was performed with vertical mattress sutures of 0 Vicryl for reduction of the cystocele.  The TVT Exact midurethral sling was performed.  The 1 cm suprapubic incisions were created with a scalpel to the right and left of the midline.  The TVT Exact was performed in a bottom-up fashion.  The Foley catheter was removed and the Foley tip with the obturator guide was placed inside the urethra and deflected properly.  The guide was placed through the right retropubic space and then up through the right suprapubic incision.  This was performed without difficulty.  The urethra was deflected in opposite direction and the same was then performed on the patient's left-hand side.  The obturator guide was removed and cystoscopy was performed and the findings were as noted above.  All cystoscopic fluid was drained and the Foley catheter was replaced. The sling was brought up through the suprapubic incisions bilaterally. A Kelly clamp was placed between the sling and the urethra, and the plastic sheaths were removed.  The sling was trimmed suprapubically. The sling was noted to be in good position.  There was a small bleeding vein along the right bladder wall, which responded to a simple suture of 2-0 Vicryl.  Surgiflo was then placed over the anterior colporrhaphy repair and up near the sites where the midurethral sling went into the retropubic space.  Hemostasis was good.  The anterior vaginal wall mucosa was trimmed and then the anterior vaginal wall was closed with a running locked suture of 2-0 Vicryl.  The posterior colporrhaphy was performed at this time.  Allis clamps were used to mark the perineal body and then the posterior vaginal mucosa up to the level of the  vaginal apex.  The perineal body and mucosa were injected locally with 1% lidocaine with epinephrine, 1:100,000.  A triangular wedge of epithelium was excised from the perineal body and the posterior mucosa was incised vertically in the midline with a Metzenbaum scissors.  Sharp and blunt dissection were used to dissect the perirectal fascia off the vaginal mucosa bilaterally.  The rectocele was reduced at this time.  A 2-0 Vicryl pursestring suture was placed at the very top of the rectocele repair. Vertical mattress sutures of 0 Vicryl were then used.  The rectocele reduced nicely.  Excess vaginal mucosa was trimmed at this time and the posterior vaginal wall was closed with a running lock suture of 2-0 Vicryl down to the hymen.  This suture was then brought behind the hymen.  A crown stitch of 0 Vicryl was placed.  The 2-0 Vicryl was used to then place running sutures along the transverse superficial perineal muscles.  This suture was then brought up the perineum in a subcuticular fashion and the knot was tied at the hymen as for an episiotomy-type repair.  Rectal exam was  performed and there was no evidence of any suture or foreign body in the rectum.  There was good support and elevation to the anterior apical and posterior vaginal walls.  A gauze packing with Estrace cream was placed inside the vagina.  The suprapubic incisions were closed with Dermabond.  The patient was awakened and extubated, and escorted to the recovery room in stable condition.  There were no complications.  All needle, instrument, and sponge counts were correct.     Lenard Galloway, M.D.     BES/MEDQ  D:  02/17/2016  T:  02/18/2016  Job:  [id number]

## 2016-02-19 ENCOUNTER — Telehealth: Payer: Self-pay | Admitting: Obstetrics and Gynecology

## 2016-02-19 ENCOUNTER — Encounter: Payer: Self-pay | Admitting: Obstetrics and Gynecology

## 2016-02-19 ENCOUNTER — Ambulatory Visit (INDEPENDENT_AMBULATORY_CARE_PROVIDER_SITE_OTHER): Payer: Medicare Other | Admitting: Obstetrics and Gynecology

## 2016-02-19 VITALS — BP 142/70 | HR 64 | Ht 64.0 in | Wt 146.0 lb

## 2016-02-19 DIAGNOSIS — R339 Retention of urine, unspecified: Secondary | ICD-10-CM

## 2016-02-19 DIAGNOSIS — Z9889 Other specified postprocedural states: Secondary | ICD-10-CM

## 2016-02-19 NOTE — Progress Notes (Signed)
Patient ID: [name], female   DOB: 1946/09/07, 70 y.o.   MRN: [id number] GYNECOLOGY  VISIT   HPI: 70 y.o.   Married  Caucasian  female   G1P1001 with No LMP recorded. Patient has had a hysterectomy.   here for 2 days post  ANTERIOR (CYSTOCELE) AND POSTERIOR REPAIR (RECTOCELE); Enterocele repair and lysis of adhesions (N/A )  TRANSVAGINAL TAPE (TVT) PROCEDURE exact midurethral sling (N/A )  CYSTO (N/A ).  Patient would like to have catheter and bag removed.   Patient was unable to void following surgery and was discharged home with a Foley and leg bag.  Rx for Cipro 250 mg po bid while catheter is in.   Not needing pain medication.   No BM  Has not taken the Miralax yet.  GYNECOLOGIC HISTORY: No LMP recorded. Patient has had a hysterectomy. Contraception:  Hysterectomy Menopausal hormone therapy:  None Last mammogram:  09/02/15, 3D, Heterogeneously dense breast tissue; Bi-Rads 1: Negative Last pap smear:05/08/09, Negative            OB History    Gravida Para Term Preterm AB TAB SAB Ectopic Multiple Living   1 1 1       1          Patient Active Problem List   Diagnosis Date Noted  . Status post surgery 02/17/2016  . Osteoporosis, unspecified 06/04/2014    Class: History of    Past Medical History  Diagnosis Date  . 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 Allergy   . Fibromyalgia   . Endometriosis   . Osteoporosis   . Osteoporosis, unspecified 06/04/2014    PCP management with medication  . Arthritis     osteoarthritis  . PONV (postoperative nausea and vomiting)     Past Surgical History  Procedure Laterality Date  . Stapedes surgery      left ear  . Appendectomy    . Total abdominal hysterectomy  1980's  . Appendectomy    . Anterior and posterior repair N/A 02/17/2016    Procedure: ANTERIOR (CYSTOCELE) AND POSTERIOR REPAIR (RECTOCELE); Enterocele repair and lysis of adhesions;  Surgeon: Nunzio Cobbs, MD;  Location: [facility];   Service: Gynecology;  Laterality: N/A;  . Bladder suspension N/A 02/17/2016    Procedure: TRANSVAGINAL TAPE (TVT) PROCEDURE exact midurethral sling;  Surgeon: Nunzio Cobbs, MD;  Location: [facility];  Service: Gynecology;  Laterality: N/A;  . Cysto N/A 02/17/2016    Procedure: Kathrene Alu;  Surgeon: Nunzio Cobbs, MD;  Location: [facility];  Service: Gynecology;  Laterality: N/A;    Current Outpatient Prescriptions  Medication Sig Dispense Refill  . Calcium Carb-Cholecalciferol 600-800 MG-UNIT TABS Take 0.5 tablets by mouth 2 (two) times daily.    . Cholecalciferol (VITAMIN D) 2000 units tablet Take 2,000 Units by mouth daily.    Marland Kitchen CINNAMON PO Take 1 capsule by mouth daily.     . ciprofloxacin (CIPRO) 250 MG tablet Take 1 tablet (250 mg total) by mouth 2 (two) times daily. 14 tablet 0  . gabapentin (NEURONTIN) 300 MG capsule Take 300 mg by mouth 3 (three) times daily.    Marland Kitchen ibandronate (BONIVA) 150 MG tablet Take 150 mg by mouth every 30 (thirty) days. Take in the morning with a full glass of water, on an empty stomach, and do not take anything else by mouth or lie down for the next 30 min.    Marland Kitchen  ibuprofen (ADVIL,MOTRIN) 600 MG tablet Take 1 tablet (600 mg total) by mouth every 6 (six) hours as needed (mild pain). 30 tablet 0  . Multiple Vitamin (MULTIVITAMIN) tablet Take 1 tablet by mouth daily.    . Omega-3 Fatty Acids (FISH OIL) 500 MG CAPS Take 1 capsule by mouth daily.     Marland Kitchen triamcinolone (NASACORT AQ) 55 MCG/ACT AERO nasal inhaler Place 2 sprays into the nose daily.    . diclofenac (VOLTAREN) 50 MG EC tablet Take 1 tablet by mouth 2 (two) times daily. Reported on 02/19/2016    . Hydrocodone-Acetaminophen (VICODIN) 5-300 MG TABS Take 1 tablet by mouth every 4 (four) hours as needed. (Patient not taking: Reported on 02/19/2016) 30 each 0   No current facility-administered medications for this visit.     ALLERGIES: Amoxicillin and Eggs or egg-derived products  Family History   Problem Relation Age of Onset  . Heart disease Father   . Stroke Father   . Hypertension Father   . Alzheimer's disease Mother   . Osteoporosis Mother     Social History   Social History  . Marital Status: Married    Spouse Name: N/A  . Number of Children: N/A  . Years of Education: N/A   Occupational History  . Not on file.   Social History Main Topics  . Smoking status: Never Smoker   . Smokeless tobacco: Never Used  . Alcohol Use: No  . Drug Use: No  . Sexual Activity:    Partners: Male    Birth Control/ Protection: Surgical     Comment: TAH   Other Topics Concern  . Not on file   Social History Narrative    ROS:  Pertinent items are noted in HPI.  PHYSICAL EXAMINATION:    Ht 5\' 4"  (1.626 m)  Wt 146 lb (66.225 kg)  BMI 25.05 kg/m2    General appearance: alert, cooperative and appears stated age  Pelvic: External genitalia:  Sp incisions intact.  Left incision with mild ecchymoses.              Foley with leg bag in placed.  Hose and leg bag removed.  Patient taught how to use Foley plug.  2 plugs given to the patient.   ASSESSMENT  Status post anterior and posterior colporrhaphy, enterocele repair, LOA, TVT, cysto.  Urinary retention.  PLAN  Empty every 3 hours by removing foley cath plug.  Continue abx.  Take Miralax.  Follow up in 4 days for Foley removal.  Patient appreciative of her care.    An After Visit Summary was printed and given to the patient.  ___10___ minutes face to face time of which over 50% was spent in counseling.

## 2016-02-19 NOTE — Telephone Encounter (Signed)
Spoke with patient. Patient had surgery on 02/17/2016 with Dr.Silva. She currently has a urinary catheter in place and would like to have it removed if possible. "I am just so worried I am going to trip over all of this stuff while I am trying to get around." States that she has emptied her catheter bag 3 times in total. The first two times she had 500-600 cc in output. The third time she had 400 cc of urine output. States she feels her bladder is completely emptying. Denies any current problems. Advised I will speak with Dr.Silva and return call with further recommendations. She is agreeable.

## 2016-02-19 NOTE — Telephone Encounter (Signed)
Patient wanted to speak with the nurse about possibly removing the catheter.

## 2016-02-19 NOTE — Telephone Encounter (Signed)
I am recommending a brief office visit to remove the hose from the Foley catheter and provide a plug for the catheter.  I will explain this at the office visit.  I do not recommend removing the entire catheter this weekend as the patient will then need to be doing potential self catheterization repeatedly if she does not empty well.  I can remove the whole catheter on Monday.

## 2016-02-19 NOTE — Telephone Encounter (Signed)
Spoke with patient. Advised of message as seen below from [place].She is agreeable and verbalizes understanding. Appointment scheduled for today 02/19/2016 at 1:30 pm with Dr.Silva. She is agreeable to date and time.  Routing to provider for final review. Patient agreeable to disposition. Will close encounter.

## 2016-02-23 ENCOUNTER — Encounter: Payer: Self-pay | Admitting: Obstetrics and Gynecology

## 2016-02-23 ENCOUNTER — Ambulatory Visit (INDEPENDENT_AMBULATORY_CARE_PROVIDER_SITE_OTHER): Payer: Medicare Other | Admitting: Obstetrics and Gynecology

## 2016-02-23 VITALS — BP 148/70 | HR 70 | Ht 64.0 in | Wt 144.8 lb

## 2016-02-23 DIAGNOSIS — R339 Retention of urine, unspecified: Secondary | ICD-10-CM | POA: Diagnosis not present

## 2016-02-23 NOTE — Progress Notes (Signed)
Patient ID: [name], female   DOB: 08/12/1946, 70 y.o.   MRN: [id number] GYNECOLOGY  VISIT   HPI: 70 y.o.   Married  Caucasian  female   G1P1001 with No LMP recorded. Patient has had a hysterectomy.   here for 1 week follow up ANTERIOR (CYSTOCELE) AND POSTERIOR REPAIR (RECTOCELE); Enterocele repair and lysis of adhesions (N/A )  TRANSVAGINAL TAPE (TVT) PROCEDURE exact midurethral sling (N/A )CYSTO (N/A ).   Was not able to void post op so was discharged home with a foley and leg bag.  Had foley bag removed last week on 3/13 and foley plug placed.  Patient has been able to manage the foley plug at home.  Her bottom is a little sore to sit down.   Took Cipro for 3 days only.   GYNECOLOGIC HISTORY: No LMP recorded. Patient has had a hysterectomy. Contraception:  Hysterectomy Menopausal hormone therapy:  None Last mammogram:  09/02/15, 3D, Heterogeneously dense breast tissue; Bi-Rads 1: Negative Last pap smear:   05-08-09 Neg        OB History    Gravida Para Term Preterm AB TAB SAB Ectopic Multiple Living   1 1 1       1          Patient Active Problem List   Diagnosis Date Noted  . Status post surgery 02/17/2016  . Osteoporosis, unspecified 06/04/2014    Class: History of    Past Medical History  Diagnosis Date  . 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 Allergy   . Fibromyalgia   . Endometriosis   . Osteoporosis   . Osteoporosis, unspecified 06/04/2014    PCP management with medication  . Arthritis     osteoarthritis  . PONV (postoperative nausea and vomiting)   . NGB (new growth of bladder)     Noted at routine cystoscopy during prolapse surgery - April 2017    Past Surgical History  Procedure Laterality Date  . Stapedes surgery      left ear  . Appendectomy    . Total abdominal hysterectomy  1980's  . Appendectomy    . Anterior and posterior repair N/A 02/17/2016    Procedure: ANTERIOR (CYSTOCELE) AND POSTERIOR REPAIR (RECTOCELE); Enterocele  repair and lysis of adhesions;  Surgeon: Nunzio Cobbs, MD;  Location: [facility];  Service: Gynecology;  Laterality: N/A;  . Bladder suspension N/A 02/17/2016    Procedure: TRANSVAGINAL TAPE (TVT) PROCEDURE exact midurethral sling;  Surgeon: Nunzio Cobbs, MD;  Location: [facility];  Service: Gynecology;  Laterality: N/A;  . Cysto N/A 02/17/2016    Procedure: Kathrene Alu;  Surgeon: Nunzio Cobbs, MD;  Location: [facility];  Service: Gynecology;  Laterality: N/A;    Current Outpatient Prescriptions  Medication Sig Dispense Refill  . Calcium Carb-Cholecalciferol 600-800 MG-UNIT TABS Take 0.5 tablets by mouth 2 (two) times daily.    . Cholecalciferol (VITAMIN D) 2000 units tablet Take 2,000 Units by mouth daily.    Marland Kitchen CINNAMON PO Take 1 capsule by mouth daily.     . diclofenac (VOLTAREN) 50 MG EC tablet Take 1 tablet by mouth 2 (two) times daily. Reported on 02/19/2016    . gabapentin (NEURONTIN) 300 MG capsule Take 300 mg by mouth 3 (three) times daily.    . Hydrocodone-Acetaminophen (VICODIN) 5-300 MG TABS Take 1 tablet by mouth every 4 (four) hours as needed. 30 each 0  . ibandronate (BONIVA) 150  MG tablet Take 150 mg by mouth every 30 (thirty) days. Take in the morning with a full glass of water, on an empty stomach, and do not take anything else by mouth or lie down for the next 30 min.    Marland Kitchen ibuprofen (ADVIL,MOTRIN) 600 MG tablet Take 1 tablet (600 mg total) by mouth every 6 (six) hours as needed (mild pain). 30 tablet 0  . Multiple Vitamin (MULTIVITAMIN) tablet Take 1 tablet by mouth daily.    . Omega-3 Fatty Acids (FISH OIL) 500 MG CAPS Take 1 capsule by mouth daily.     Marland Kitchen triamcinolone (NASACORT AQ) 55 MCG/ACT AERO nasal inhaler Place 2 sprays into the nose daily.     No current facility-administered medications for this visit.     ALLERGIES: Amoxicillin and Eggs or egg-derived products  Family History  Problem Relation Age of Onset  . Heart disease Father   . Stroke  Father   . Hypertension Father   . Alzheimer's disease Mother   . Osteoporosis Mother     Social History   Social History  . Marital Status: Married    Spouse Name: N/A  . Number of Children: N/A  . Years of Education: N/A   Occupational History  . Not on file.   Social History Main Topics  . Smoking status: Never Smoker   . Smokeless tobacco: Never Used  . Alcohol Use: No  . Drug Use: No  . Sexual Activity:    Partners: Male    Birth Control/ Protection: Surgical     Comment: TAH   Other Topics Concern  . Not on file   Social History Narrative    ROS:  Pertinent items are noted in HPI.  PHYSICAL EXAMINATION:    BP 148/70 mmHg  Pulse 70  Ht 5\' 4"  (1.626 m)  Wt 144 lb 12.8 oz (65.681 kg)  BMI 24.84 kg/m2    General appearance: alert, cooperative and appears stated age  Pelvic: External genitalia:  SP incisions intact with ecchymoses of the bilateral labia superiorly.  Left suprapubic with hematoma 1.5 cm, nontender, not draining.                 Bimanual Exam:  Uterus:  uterus absent              Adnexa: no mass, fullness, tenderness              Suture lines intact.  No agglutination. Sling protected.  Voiding trail - Voided into measuring hat and missed the hat and most went into the commode. State she voided about a cup to a cup and a half. Sterile PVR check after verbal consent -  Sterile prep with betadine.  Sterile cath for 125 cc.  Clear and yellow.  Discarded.   Chaperone was present for exam.  ASSESSMENT  Status post ant and post colporrhaphy, TVT, cysto.  Urinary retention resolved.   Left suprapubic hematoma.  No sign of abscess.  PLAN  Will do voiding trail in the office.  Hematoma discussed.  I expect this to resolve spontaneously.  Return for difficulty voiding.  Return for 6 week post op check.    An After Visit Summary was printed and given to the patient.

## 2016-02-24 ENCOUNTER — Encounter: Payer: Self-pay | Admitting: Obstetrics and Gynecology

## 2016-02-25 NOTE — Discharge Summary (Signed)
Physician Discharge Summary  Patient ID: [name] MRN: [id number] DOB/AGE: [birth year] 70 y.o.  Admit date: 02/17/2016 Discharge date: 02/18/16 Admission Diagnoses: 1.  Cystocele.  2.  Rectocele.  3.  Occult genuine stress incontinence.   Discharge Diagnoses:  1.  Cystocele.  2.  Rectocele.  3.  Occult genuine stress incontinence.  4.  Status post anterior and posterior colporrhaphy, enterocele repair, cul de sac lysis of adhesions, cystoscopy.  5.  Bladder growth.  6.  Urinary retention post op.    Active Problems:   Status post surgery   Discharged Condition: good  Hospital Course:  The patient was admitted on 02/17/16 for an anterior and posterior colporrhaphy, enterocele repair, cul de sac lysis of adhesions, cystoscopy which were performed without complication while under general anesthesia.  The patient's post op course was uneventful.  She had a morphine PCA and Toradol for pain control initially, and this was converted over to Percocet and Motrin on post op day one when the patient began taking po well.  She ambulated independently and wore PAS and Ted hose for DVT prophylaxis while in bed.  Her foley catheter were removed on post op day one, and she was unable to void with her voiding trials.  She was therefore discharge to home with a Foley catheter and a leg bag. The patient's vital signs remained stable and she demonstrated no signs of infection during her hospitalization.  The patient's post op day one Hgb was 10.4.   She was tolerating the this well.  She had very minimal vaginal bleeding, and her incision(s) demonstrated no signs of erythema or significant drainage.  She was found to be in good condition and ready for discharge on post op day one.   Consults: None  Significant Diagnostic Studies: labs: See hospital course.  Intraoperative cystoscopy revealed a 4 mm raised lesion of the bladder trigone.   Treatments: surgery:  anterior and posterior colporrhaphy,  enterocele repair, cul de sac lysis of adhesions, cystoscopy.  Discharge Exam: Blood pressure 115/51, pulse 59, temperature 97.8 F (36.6 C), temperature source Oral, resp. rate 16, height 5\' 4"  (1.626 m), weight 147 lb (66.679 kg), SpO2 98 %.   I/O - 4220 cc IV/2725 cc UO  General: alert and cooperative Resp: clear to auscultation bilaterally Cardio: regular rate and rhythm, S1, S2 normal, no murmur, click, rub or gallop GI: soft, non-tender; bowel sounds normal; no masses, no organomegaly and incision: clean, dry and intact Extremities: Ted hose on. Vaginal Bleeding: minimal and  no active bleeding noted.   Disposition: 01-Home or Self Care  Instructions and precautions were given in verbal and written form.  The patient will take Ciprofloxacin 250 mg po bid for infection prevention while the foley catheter is in place.  The patient will see urology in an ambulatory setting for cystoscopy and bladder biopsy.  This was discussed with the patient during her hospital stay.     Medication List    STOP taking these medications        acetaminophen 500 MG tablet  Commonly known as:  TYLENOL     oxybutynin 3.9 MG/24HR  Commonly known as:  OXYTROL     ZETIA 10 MG tablet  Generic drug:  ezetimibe      TAKE these medications        Calcium Carb-Cholecalciferol 600-800 MG-UNIT Tabs  Take 0.5 tablets by mouth 2 (two) times daily.     CINNAMON PO  Take 1 capsule by mouth daily.  Fish Oil 500 MG Caps  Take 1 capsule by mouth daily.     gabapentin 300 MG capsule  Commonly known as:  NEURONTIN  Take 300 mg by mouth 3 (three) times daily.     Hydrocodone-Acetaminophen 5-300 MG Tabs  Commonly known as:  VICODIN  Take 1 tablet by mouth every 4 (four) hours as needed.     ibandronate 150 MG tablet  Commonly known as:  BONIVA  Take 150 mg by mouth every 30 (thirty) days. Take in the morning with a full glass of water, on an empty stomach, and do not take anything else by  mouth or lie down for the next 30 min.     ibuprofen 600 MG tablet  Commonly known as:  ADVIL,MOTRIN  Take 1 tablet (600 mg total) by mouth every 6 (six) hours as needed (mild pain).     multivitamin tablet  Take 1 tablet by mouth daily.     NASACORT AQ 55 MCG/ACT Aero nasal inhaler  Generic drug:  triamcinolone  Place 2 sprays into the nose daily.     Vitamin D 2000 units tablet  Take 2,000 Units by mouth daily.           Follow-up Information    Follow up with Arloa Koh, MD In 5 days.   Specialty:  Obstetrics and Gynecology   Contact information:   [address] [place] [address] [phone]       Signed: Arloa Koh 02/25/2016, 12:02 PM

## 2016-02-26 ENCOUNTER — Telehealth: Payer: Self-pay | Admitting: Obstetrics and Gynecology

## 2016-02-26 NOTE — Telephone Encounter (Signed)
Please contact patient and make an appointment to see [facility] regarding a bladder growth that I noted at the time of her prolapse repair/TVT sling on 02/17/16. This was a small raised area.  Patient has already been informed.   I would like for her to be seen during the next few weeks.  Her foley catheter is now out and she is voiding on her own.

## 2016-02-26 NOTE — Telephone Encounter (Signed)
Call to [facility]. First available appointment is June 6. Transferred to triage nurse and spoke to [place]. Need to fax records to [phone] and they will review records with provider and work patient in.

## 2016-02-27 NOTE — Telephone Encounter (Signed)
Call to patient to provide update on referral status with [facility]. Advised waiting for traige to review and get work in appointment.  Patient states she has received call from [facility] and they have given her an appointment for Thursday 03-04-16.  Apologized to patient for miscommunication.   Routing to provider for final review. Patient agreeable to disposition. Will close encounter.

## 2016-03-26 ENCOUNTER — Encounter: Payer: Self-pay | Admitting: Obstetrics and Gynecology

## 2016-03-26 ENCOUNTER — Ambulatory Visit (INDEPENDENT_AMBULATORY_CARE_PROVIDER_SITE_OTHER): Payer: Medicare Other | Admitting: Obstetrics and Gynecology

## 2016-03-26 VITALS — BP 134/80 | HR 70 | Ht 64.0 in | Wt 144.0 lb

## 2016-03-26 DIAGNOSIS — Z9889 Other specified postprocedural states: Secondary | ICD-10-CM

## 2016-03-26 NOTE — Progress Notes (Signed)
Patient ID: [name], female   DOB: Feb 11, 1946, 70 y.o.   MRN: [id number] GYNECOLOGY  VISIT   HPI: 70 y.o.   Married  Caucasian  female   G1P1001 with No LMP recorded. Patient has had a hysterectomy.   here for 6 week follow up ANTERIOR (CYSTOCELE) AND POSTERIOR REPAIR (RECTOCELE); Enterocele repair and lysis of adhesions (N/A ) TRANSVAGINAL TAPE (TVT) PROCEDURE exact midurethral sling (N/A )CYSTO (N/A ).   Feeling good overall other than a fibromyalgia flare.  Bladder and bowel function good.  Little bit of clear discharge.  No leaks with laugh or sneeze.  Had urology consultation for my intraoperative finding of a bladder growth. Received dx of cystitis cystica.  No bx needed of the bladder for this benign condition.  Asking about starting Oxytrol back again.  Day time frequency is spacing out.  Up once at hs.  GYNECOLOGIC HISTORY: No LMP recorded. Patient has had a hysterectomy. Contraception:  Hysterectomy Menopausal hormone therapy:  None Last mammogram:  09/02/15, 3D, Heterogeneously dense breast tissue; Bi-Rads 1: Negative. Last pap smear: 05-08-09 Neg          OB History    Gravida Para Term Preterm AB TAB SAB Ectopic Multiple Living   1 1 1       1          Patient Active Problem List   Diagnosis Date Noted  . Status post surgery 02/17/2016  . Osteoporosis, unspecified 06/04/2014    Class: History of    Past Medical History  Diagnosis Date  . 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 Allergy   . Fibromyalgia   . Endometriosis   . Osteoporosis   . Osteoporosis, unspecified 06/04/2014    PCP management with medication  . Arthritis     osteoarthritis  . PONV (postoperative nausea and vomiting)   . NGB (new growth of bladder)     Noted at routine cystoscopy during prolapse surgery - April 2017.  Urology evaluationn benign and no biopsy needed.    Past Surgical History  Procedure Laterality Date  . Stapedes surgery      left ear  . Appendectomy     . Total abdominal hysterectomy  1980's  . Appendectomy    . Anterior and posterior repair N/A 02/17/2016    Procedure: ANTERIOR (CYSTOCELE) AND POSTERIOR REPAIR (RECTOCELE); Enterocele repair and lysis of adhesions;  Surgeon: Nunzio Cobbs, MD;  Location: [facility];  Service: Gynecology;  Laterality: N/A;  . Bladder suspension N/A 02/17/2016    Procedure: TRANSVAGINAL TAPE (TVT) PROCEDURE exact midurethral sling;  Surgeon: Nunzio Cobbs, MD;  Location: [facility];  Service: Gynecology;  Laterality: N/A;  . Cysto N/A 02/17/2016    Procedure: Kathrene Alu;  Surgeon: Nunzio Cobbs, MD;  Location: [facility];  Service: Gynecology;  Laterality: N/A;    Current Outpatient Prescriptions  Medication Sig Dispense Refill  . Calcium Carb-Cholecalciferol 600-800 MG-UNIT TABS Take 0.5 tablets by mouth 2 (two) times daily.    . Cholecalciferol (VITAMIN D) 2000 units tablet Take 2,000 Units by mouth daily.    Marland Kitchen CINNAMON PO Take 1 capsule by mouth daily.     . diclofenac (VOLTAREN) 50 MG EC tablet Take 1 tablet by mouth 2 (two) times daily. Reported on 02/19/2016    . gabapentin (NEURONTIN) 300 MG capsule Take 300 mg by mouth 3 (three) times daily.    Marland Kitchen ibandronate (BONIVA) 150 MG  tablet Take 150 mg by mouth every 30 (thirty) days. Take in the morning with a full glass of water, on an empty stomach, and do not take anything else by mouth or lie down for the next 30 min.    . Multiple Vitamin (MULTIVITAMIN) tablet Take 1 tablet by mouth daily.    . Omega-3 Fatty Acids (FISH OIL) 500 MG CAPS Take 1 capsule by mouth daily.     Marland Kitchen triamcinolone (NASACORT AQ) 55 MCG/ACT AERO nasal inhaler Place 2 sprays into the nose daily.     No current facility-administered medications for this visit.     ALLERGIES: Amoxicillin and Eggs or egg-derived products  Family History  Problem Relation Age of Onset  . Heart disease Father   . Stroke Father   . Hypertension Father   . Alzheimer's disease Mother    . Osteoporosis Mother     Social History   Social History  . Marital Status: Married    Spouse Name: N/A  . Number of Children: N/A  . Years of Education: N/A   Occupational History  . Not on file.   Social History Main Topics  . Smoking status: Never Smoker   . Smokeless tobacco: Never Used  . Alcohol Use: No  . Drug Use: No  . Sexual Activity:    Partners: Male    Birth Control/ Protection: Surgical     Comment: TAH   Other Topics Concern  . Not on file   Social History Narrative    ROS:  Pertinent items are noted in HPI.  PHYSICAL EXAMINATION:    BP 134/80 mmHg  Pulse 70  Ht 5\' 4"  (1.626 m)  Wt 144 lb (65.318 kg)  BMI 24.71 kg/m2    General appearance: alert, cooperative and appears stated age.  Pelvic: External genitalia:  no lesions              Urethra:  normal appearing urethra with no masses, tenderness or lesions              Bartholins and Skenes: normal                 Vagina: normal appearing vagina with normal color and discharge, no lesions.  Suture lines intact.  Good support.              Cervix: absent          Bimanual Exam:  Uterus:  uterus absent              Adnexa: normal adnexa and no mass, fullness, tenderness            Chaperone was present for exam.  ASSESSMENT  Doing well status post ant and post repair with TVT Exact and cysto. Benign bladder growth. Overactive bladder.  Improved post op.  Patient off medication.  PLAN  Continue decreased activity.  No anticholinergic or antimuscarinic at this time.  Patient will call if symptoms increase and she feels she needs to restart.  Follow up in 6 week for final post op check. Patient expressing her appreciation for her care and asked if she could give me a hug.  Yes!  An After Visit Summary was printed and given to the patient.

## 2016-03-28 ENCOUNTER — Encounter: Payer: Self-pay | Admitting: Obstetrics and Gynecology

## 2016-04-22 ENCOUNTER — Telehealth: Payer: Self-pay | Admitting: Obstetrics and Gynecology

## 2016-04-22 NOTE — Telephone Encounter (Signed)
Please disregard made in error °

## 2016-05-07 ENCOUNTER — Encounter: Payer: Self-pay | Admitting: Obstetrics and Gynecology

## 2016-05-07 ENCOUNTER — Ambulatory Visit (INDEPENDENT_AMBULATORY_CARE_PROVIDER_SITE_OTHER): Payer: Medicare Other | Admitting: Obstetrics and Gynecology

## 2016-05-07 VITALS — BP 132/70 | HR 64 | Resp 16 | Wt 142.0 lb

## 2016-05-07 DIAGNOSIS — Z9889 Other specified postprocedural states: Secondary | ICD-10-CM

## 2016-05-07 NOTE — Progress Notes (Signed)
GYNECOLOGY  VISIT   HPI: 70 y.o.   Married  Caucasian  female   G1P1001 with No LMP recorded. Patient has had a hysterectomy.   here for final post op visit.   Not sexually active since prior to surgery.   GYNECOLOGIC HISTORY: No LMP recorded. Patient has had a hysterectomy. Contraception:  hysterectomy Menopausal hormone therapy:  none Last mammogram:  09/02/15 BIRADS1 negative Last pap smear:   05/08/09 negative        OB History    Gravida Para Term Preterm AB TAB SAB Ectopic Multiple Living   1 1 1       1          Patient Active Problem List   Diagnosis Date Noted  . Status post surgery 02/17/2016  . Osteoporosis, unspecified 06/04/2014    Class: History of    Past Medical History  Diagnosis Date  . 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 Allergy   . Fibromyalgia   . Endometriosis   . Osteoporosis   . Osteoporosis, unspecified 06/04/2014    PCP management with medication  . Arthritis     osteoarthritis  . PONV (postoperative nausea and vomiting)   . NGB (new growth of bladder)     Noted at routine cystoscopy during prolapse surgery - April 2017.  Urology evaluationn benign and no biopsy needed.    Past Surgical History  Procedure Laterality Date  . Stapedes surgery      left ear  . Appendectomy    . Total abdominal hysterectomy  1980's  . Appendectomy    . Anterior and posterior repair N/A 02/17/2016    Procedure: ANTERIOR (CYSTOCELE) AND POSTERIOR REPAIR (RECTOCELE); Enterocele repair and lysis of adhesions;  Surgeon: Nunzio Cobbs, MD;  Location: [facility];  Service: Gynecology;  Laterality: N/A;  . Bladder suspension N/A 02/17/2016    Procedure: TRANSVAGINAL TAPE (TVT) PROCEDURE exact midurethral sling;  Surgeon: Nunzio Cobbs, MD;  Location: [facility];  Service: Gynecology;  Laterality: N/A;  . Cysto N/A 02/17/2016    Procedure: Kathrene Alu;  Surgeon: Nunzio Cobbs, MD;  Location: [facility];  Service: Gynecology;  Laterality: N/A;     Current Outpatient Prescriptions  Medication Sig Dispense Refill  . Calcium Carb-Cholecalciferol 600-800 MG-UNIT TABS Take 0.5 tablets by mouth 2 (two) times daily.    . Cholecalciferol (VITAMIN D) 2000 units tablet Take 2,000 Units by mouth daily.    Marland Kitchen CINNAMON PO Take 1 capsule by mouth daily.     . diclofenac (VOLTAREN) 50 MG EC tablet Take 1 tablet by mouth 2 (two) times daily. Reported on 02/19/2016    . gabapentin (NEURONTIN) 300 MG capsule Take 300 mg by mouth 3 (three) times daily.    Marland Kitchen ibandronate (BONIVA) 150 MG tablet Take 150 mg by mouth every 30 (thirty) days. Take in the morning with a full glass of water, on an empty stomach, and do not take anything else by mouth or lie down for the next 30 min.    . Multiple Vitamin (MULTIVITAMIN) tablet Take 1 tablet by mouth daily.    . Omega-3 Fatty Acids (FISH OIL) 500 MG CAPS Take 1 capsule by mouth daily.     Marland Kitchen triamcinolone (NASACORT AQ) 55 MCG/ACT AERO nasal inhaler Place 2 sprays into the nose daily.     No current facility-administered medications for this visit.     ALLERGIES: Amoxicillin and Eggs or egg-derived  products  Family History  Problem Relation Age of Onset  . Heart disease Father   . Stroke Father   . Hypertension Father   . Alzheimer's disease Mother   . Osteoporosis Mother     Social History   Social History  . Marital Status: Married    Spouse Name: N/A  . Number of Children: N/A  . Years of Education: N/A   Occupational History  . Not on file.   Social History Main Topics  . Smoking status: Never Smoker   . Smokeless tobacco: Never Used  . Alcohol Use: No  . Drug Use: No  . Sexual Activity:    Partners: Male    Birth Control/ Protection: Surgical     Comment: TAH   Other Topics Concern  . Not on file   Social History Narrative    ROS:  Pertinent items are noted in HPI.  PHYSICAL EXAMINATION:    BP 132/70 mmHg  Pulse 64  Resp 16  Wt 142 lb (64.411 kg)    General appearance:  alert, cooperative and appears stated age   Pelvic: External genitalia:  no lesions              Urethra:  normal appearing urethra with no masses, tenderness or lesions              Bartholins and Skenes: normal                 Vagina: normal appearing vagina with normal color and discharge, no lesions.  Has tight spot at vaginal apex in the midline.  Adhesion versus enterocele repair.              Cervix: absent           Bimanual Exam:  Uterus:  uterus absent              Adnexa: no mass, fullness, tenderness           Chaperone was present for exam.  ASSESSMENT  Status post anterior and posterior repair, enterocele repair, and TVT with cystoscopy. Midline vaginal apical constriction.  PLAN  OK to resume all activities in one more week.  I discussed the vaginal apical change and encouraged patient to call if she is experiencing discomfort with intercourse.  We can use some vaginal estrogens if needed.  If necessary, this area could be lysed.  I do have some concerns that this could result in some apical prolapse recurrence, however. Follow up for annual exams and prn.  Patient hugged me at the end of the visit and expressed her appreciation for her care.  An After Visit Summary was printed and given to the patient.

## 2016-06-16 ENCOUNTER — Ambulatory Visit (INDEPENDENT_AMBULATORY_CARE_PROVIDER_SITE_OTHER): Payer: Medicare Other | Admitting: Certified Nurse Midwife

## 2016-06-16 ENCOUNTER — Encounter: Payer: Self-pay | Admitting: Certified Nurse Midwife

## 2016-06-16 VITALS — BP 120/80 | HR 64 | Resp 16 | Ht 64.25 in | Wt 143.0 lb

## 2016-06-16 DIAGNOSIS — Z01419 Encounter for gynecological examination (general) (routine) without abnormal findings: Secondary | ICD-10-CM | POA: Diagnosis not present

## 2016-06-16 NOTE — Progress Notes (Signed)
70 y.o. G43P1001 Married  Caucasian Fe here for annual exam. Menopausal no HRT. Denies any vaginal dryness. So happy with her cystocele surgery!! No more incontinence. Sees PCP for aex/labs and medication management of osteoporosis. Sees neurologist for tremors and medication management. No health issues today. Planning beach trip with sister!  No LMP recorded. Patient has had a hysterectomy.          Sexually active: Yes.    The current method of family planning is status post hysterectomy.    Exercising: Yes.    walking Smoker:  no  Health Maintenance: Pap:  05-08-09 neg MMG:  09-02-15 category c density birads 1:neg Colonoscopy:  6/14 neg BMD:   2016 TDaP:  2015 Shingles: had done Pneumonia: had done Hep C and HIV: not done Labs: pcp Self breast exam: done occ   reports that she has never smoked. She has never used smokeless tobacco. She reports that she does not drink alcohol or use drugs.  Past Medical History:  Diagnosis Date  . Allergy   . Arthritis    osteoarthritis  . Endometriosis   . Fibromyalgia   . NGB (new growth of bladder)    Noted at routine cystoscopy during prolapse surgery - April 2017.  Urology evaluationn benign and no biopsy needed.  . Osteoporosis   . Osteoporosis, unspecified 06/04/2014   PCP management with medication  . PONV (postoperative nausea and vomiting)   . 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 ANTERIOR AND POSTERIOR REPAIR N/A 02/17/2016   Procedure: ANTERIOR (CYSTOCELE) AND POSTERIOR REPAIR (RECTOCELE); Enterocele repair and lysis of adhesions;  Surgeon: Nunzio Cobbs, MD;  Location: [facility];  Service: Gynecology;  Laterality: N/A;  . APPENDECTOMY    . APPENDECTOMY    . BLADDER SUSPENSION N/A 02/17/2016   Procedure: TRANSVAGINAL TAPE (TVT) PROCEDURE exact midurethral sling;  Surgeon: Nunzio Cobbs, MD;  Location: [facility];  Service: Gynecology;  Laterality: N/A;  .  CYSTO N/A 02/17/2016   Procedure: Kathrene Alu;  Surgeon: Nunzio Cobbs, MD;  Location: [facility];  Service: Gynecology;  Laterality: N/A;  . STAPEDES SURGERY     left ear  . TOTAL ABDOMINAL HYSTERECTOMY  1980's    Current Outpatient Prescriptions  Medication Sig Dispense Refill  . Calcium Carb-Cholecalciferol 600-800 MG-UNIT TABS Take 0.5 tablets by mouth 2 (two) times daily.    . Cholecalciferol (VITAMIN D) 2000 units tablet Take 2,000 Units by mouth daily.    Marland Kitchen CINNAMON PO Take 1 capsule by mouth daily.     . diclofenac (VOLTAREN) 50 MG EC tablet Take 1 tablet by mouth 2 (two) times daily. Reported on 02/19/2016    . gabapentin (NEURONTIN) 300 MG capsule Take 300 mg by mouth 3 (three) times daily.    Marland Kitchen ibandronate (BONIVA) 150 MG tablet Take 150 mg by mouth every 30 (thirty) days. Take in the morning with a full glass of water, on an empty stomach, and do not take anything else by mouth or lie down for the next 30 min.    . Multiple Vitamin (MULTIVITAMIN) tablet Take 1 tablet by mouth daily.    . Omega-3 Fatty Acids (FISH OIL) 500 MG CAPS Take 1 capsule by mouth daily.     Marland Kitchen triamcinolone (NASACORT AQ) 55 MCG/ACT AERO nasal inhaler Place 2 sprays into the nose daily.     No current facility-administered medications  for this visit.     Family History  Problem Relation Age of Onset  . Heart disease Father   . Stroke Father   . Hypertension Father   . Alzheimer's disease Mother   . Osteoporosis Mother     ROS:  Pertinent items are noted in HPI.  Otherwise, a comprehensive ROS was negative.  Exam:   BP 120/80   Pulse 64   Resp 16   Ht 5' 4.25" (1.632 m)   Wt 143 lb (64.9 kg)   BMI 24.36 kg/m  Height: 5' 4.25" (163.2 cm) Ht Readings from Last 3 Encounters:  06/16/16 5' 4.25" (1.632 m)  03/26/16 5\' 4"  (1.626 m)  02/23/16 5\' 4"  (1.626 m)    General appearance: alert, cooperative and appears stated age Head: Normocephalic, without obvious abnormality, atraumatic Neck: no  adenopathy, supple, symmetrical, trachea midline and thyroid normal to inspection and palpation Lungs: clear to auscultation bilaterally Breasts: normal appearance, no masses or tenderness, No nipple retraction or dimpling, No nipple discharge or bleeding, No axillary or supraclavicular adenopathy Heart: regular rate and rhythm Abdomen: soft, non-tender; no masses,  no organomegaly Extremities: extremities normal, atraumatic, no cyanosis or edema Skin: Skin color, texture, turgor normal. No rashes or lesions Lymph nodes: Cervical, supraclavicular, and axillary nodes normal. No abnormal inguinal nodes palpated Neurologic: Grossly normal   Pelvic: External genitalia:  no lesions              Urethra:  normal appearing urethra with no masses, tenderness or lesions              Bartholin's and Skene's: normal                 Vagina: normal appearing vagina with normal color and discharge, no lesions              Cervix: absent              Pap taken: No. Bimanual Exam:  Uterus:  uterus absent              Adnexa: normal adnexa and no mass, fullness, tenderness               Rectovaginal: Confirms               Anus:  normal sphincter tone, no lesions  Chaperone present: yes  A:  Well Woman with normal exam  Menopausal no HRT s/p TAH for endometriosis ovaries retained  Recent vaginal vault repair for cystocele/rectocele with great results  MD management of Osteoporosis and tremors    P:   Reviewed health and wellness pertinent to exam  Will advise if any concerns since surgery  Continue follow up with PCP regarding medication management  Pap smear as above not taken   counseled on breast self exam, mammography screening, adequate intake of calcium and vitamin D, diet and exercise  return annually or prn  An After Visit Summary was printed and given to the patient.

## 2016-06-16 NOTE — Patient Instructions (Signed)

## 2016-06-21 NOTE — Progress Notes (Signed)
Encounter reviewed Monti Jilek, MD   

## 2016-08-16 ENCOUNTER — Other Ambulatory Visit: Payer: Self-pay | Admitting: Internal Medicine

## 2016-08-16 DIAGNOSIS — Z1231 Encounter for screening mammogram for malignant neoplasm of breast: Secondary | ICD-10-CM

## 2016-09-02 ENCOUNTER — Ambulatory Visit
Admission: RE | Admit: 2016-09-02 | Discharge: 2016-09-02 | Disposition: A | Discharge status: Home / Self Care | Payer: Medicare Other | Source: Ambulatory Visit | Attending: Internal Medicine | Admitting: Internal Medicine

## 2016-09-02 DIAGNOSIS — Z1231 Encounter for screening mammogram for malignant neoplasm of breast: Secondary | ICD-10-CM

## 2016-11-15 ENCOUNTER — Encounter (HOSPITAL_BASED_OUTPATIENT_CLINIC_OR_DEPARTMENT_OTHER): Payer: Self-pay | Admitting: *Deleted

## 2016-11-15 ENCOUNTER — Emergency Department (HOSPITAL_BASED_OUTPATIENT_CLINIC_OR_DEPARTMENT_OTHER): Payer: Medicare Other

## 2016-11-15 ENCOUNTER — Emergency Department (HOSPITAL_BASED_OUTPATIENT_CLINIC_OR_DEPARTMENT_OTHER)
Admission: EM | Admit: 2016-11-15 | Discharge: 2016-11-15 | Disposition: A | Discharge status: Home / Self Care | Payer: Medicare Other | Attending: Emergency Medicine | Admitting: Emergency Medicine

## 2016-11-15 DIAGNOSIS — R05 Cough: Secondary | ICD-10-CM | POA: Diagnosis present

## 2016-11-15 DIAGNOSIS — J069 Acute upper respiratory infection, unspecified: Secondary | ICD-10-CM | POA: Diagnosis not present

## 2016-11-15 DIAGNOSIS — Z79899 Other long term (current) drug therapy: Secondary | ICD-10-CM | POA: Insufficient documentation

## 2016-11-15 NOTE — ED Provider Notes (Signed)
[facility] Provider Note   CSN: [id number] Arrival date & time: 11/15/16  [id number]     History   Chief Complaint Chief Complaint  Patient presents with  . Cough    HPI [name] is a 71 y.o. female.  HPI Patient has had a cough for the last 3 weeks. Also head congestion and nasal drainage. States she is coughing up some clear sputum now. No sore throat. States her also has had similar symptoms. States she continues to have symptoms and thought is worse getting checked out now. Initially there was some colored sputum but now is clear. No nausea vomiting. No fevers. States she does feel little weak. She does not smoke.   Past Medical History:  Diagnosis Date  . Allergy   . Arthritis    osteoarthritis  . Endometriosis   . Fibromyalgia   . NGB (new growth of bladder)    Noted at routine cystoscopy during prolapse surgery - April 2017.  Urology evaluationn benign and no biopsy needed.  . Osteoporosis   . Osteoporosis, unspecified 06/04/2014   PCP management with medication  . PONV (postoperative nausea and vomiting)   . 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 Status post surgery 02/17/2016  . Osteoporosis, unspecified 06/04/2014    Class: History of    Past Surgical History:  Procedure Laterality Date  . ANTERIOR AND POSTERIOR REPAIR N/A 02/17/2016   Procedure: ANTERIOR (CYSTOCELE) AND POSTERIOR REPAIR (RECTOCELE); Enterocele repair and lysis of adhesions;  Surgeon: Nunzio Cobbs, MD;  Location: [facility];  Service: Gynecology;  Laterality: N/A;  . APPENDECTOMY    . APPENDECTOMY    . BLADDER SUSPENSION N/A 02/17/2016   Procedure: TRANSVAGINAL TAPE (TVT) PROCEDURE exact midurethral sling;  Surgeon: Nunzio Cobbs, MD;  Location: [facility];  Service: Gynecology;  Laterality: N/A;  . CYSTO N/A 02/17/2016   Procedure: Kathrene Alu;  Surgeon: Nunzio Cobbs, MD;  Location: [facility];   Service: Gynecology;  Laterality: N/A;  . STAPEDES SURGERY     left ear  . TOTAL ABDOMINAL HYSTERECTOMY  1980's    OB History    Gravida Para Term Preterm AB Living   1 1 1     1    SAB TAB Ectopic Multiple Live Births                   Home Medications    Prior to Admission medications   Medication Sig Start Date End Date Taking? Authorizing Provider  Calcium Carb-Cholecalciferol 600-800 MG-UNIT TABS Take 0.5 tablets by mouth 2 (two) times daily.   Yes Historical Provider, MD  Cholecalciferol (VITAMIN D) 2000 units tablet Take 2,000 Units by mouth daily.   Yes Historical Provider, MD  CINNAMON PO Take 1 capsule by mouth as needed.    Yes Historical Provider, MD  diclofenac (VOLTAREN) 50 MG EC tablet Take 1 tablet by mouth daily. Reported on 02/19/2016 02/06/16  Yes Historical Provider, MD  gabapentin (NEURONTIN) 300 MG capsule Take 300 mg by mouth 3 (three) times daily.   Yes Historical Provider, MD  ibandronate (BONIVA) 150 MG tablet Take 150 mg by mouth every 30 (thirty) days. Take in the morning with a full glass of water, on an empty stomach, and do not take anything else by mouth or lie down for the next 30 min.   Yes Historical Provider,  MD  Multiple Vitamin (MULTIVITAMIN) tablet Take 1 tablet by mouth daily.   Yes Historical Provider, MD  Omega-3 Fatty Acids (FISH OIL) 500 MG CAPS Take 1 capsule by mouth daily.    Yes Historical Provider, MD  triamcinolone (NASACORT AQ) 55 MCG/ACT AERO nasal inhaler Place 2 sprays into the nose daily.   Yes Historical Provider, MD    Family History Family History  Problem Relation Age of Onset  . Heart disease Father   . Stroke Father   . Hypertension Father   . Alzheimer's disease Mother   . Osteoporosis Mother     Social History Social History  Substance Use Topics  . Smoking status: Never Smoker  . Smokeless tobacco: Never Used  . Alcohol use No     Allergies   Amoxicillin and Eggs or egg-derived products   Review of  Systems Review of Systems  Constitutional: Positive for fatigue. Negative for appetite change.  HENT: Positive for congestion. Negative for facial swelling, rhinorrhea and sore throat.   Respiratory: Positive for cough.   Cardiovascular: Negative for chest pain.  Gastrointestinal: Negative for abdominal pain.  Genitourinary: Negative for dysuria.  Musculoskeletal: Negative for back pain.  Skin: Negative for wound.  Neurological: Negative for numbness.  Hematological: Negative for adenopathy.     Physical Exam Updated Vital Signs BP 142/81 (BP Location: Left Arm)   Pulse 98   Temp 97.9 F (36.6 C) (Oral)   Resp 18   Ht 5\' 4"  (1.626 m)   Wt 135 lb (61.2 kg)   SpO2 96%   BMI 23.17 kg/m   Physical Exam  Constitutional: She appears well-developed.  HENT:  Head: Atraumatic.  Mouth/Throat: No oropharyngeal exudate.  Neck: Neck supple.  Cardiovascular: Normal rate.   Pulmonary/Chest: Effort normal. No respiratory distress. She has no wheezes.  Abdominal: Soft.  Musculoskeletal: She exhibits no edema.  Neurological: She is alert.  Skin: Skin is warm. Capillary refill takes less than 2 seconds.     ED Treatments / Results  Labs (all labs ordered are listed, but only abnormal results are displayed) Labs Reviewed - No data to display  EKG  EKG Interpretation None       Radiology Dg Chest 2 View  Result Date: 11/15/2016 CLINICAL DATA:  Cough, congestion for 3 weeks EXAM: CHEST  2 VIEW COMPARISON:  None. FINDINGS: Cardiomediastinal silhouette is unremarkable. No infiltrate or pleural effusion. No pulmonary edema. Mild hyperinflation. Osteopenia and mild degenerative changes thoracic spine. IMPRESSION: No active cardiopulmonary disease.  Mild hyperinflation. Electronically Signed   By: Lahoma Crocker M.D.   On: 11/15/2016 10:29    Procedures Procedures (including critical care time)  Medications Ordered in ED Medications - No data to display   Initial Impression /  Assessment and Plan / ED Course  I have reviewed the triage vital signs and the nursing notes.  Pertinent labs & imaging results that were available during my care of the patient were reviewed by me and considered in my medical decision making (see chart for details).  Clinical Course     Patient with URI symptoms. X-ray negative. Well-appearing. Will discharge home.  Final Clinical Impressions(s) / ED Diagnoses   Final diagnoses:  Upper respiratory tract infection, unspecified type    New Prescriptions New Prescriptions   No medications on file     Davonna Belling, MD 11/15/16 1110

## 2016-11-15 NOTE — ED Triage Notes (Signed)
Cough and congestion x3 weeks.

## 2016-11-15 NOTE — ED Notes (Signed)
ED Provider at bedside. 

## 2017-06-22 ENCOUNTER — Ambulatory Visit: Payer: Medicare Other | Admitting: Certified Nurse Midwife

## 2017-07-27 ENCOUNTER — Other Ambulatory Visit: Payer: Self-pay | Admitting: Internal Medicine

## 2017-07-27 DIAGNOSIS — Z1231 Encounter for screening mammogram for malignant neoplasm of breast: Secondary | ICD-10-CM

## 2017-08-11 ENCOUNTER — Encounter: Payer: Self-pay | Admitting: Certified Nurse Midwife

## 2017-08-11 ENCOUNTER — Ambulatory Visit (INDEPENDENT_AMBULATORY_CARE_PROVIDER_SITE_OTHER): Payer: Medicare Other | Admitting: Certified Nurse Midwife

## 2017-08-11 VITALS — BP 120/70 | HR 70 | Resp 16 | Ht 64.25 in | Wt 142.0 lb

## 2017-08-11 DIAGNOSIS — Z01419 Encounter for gynecological examination (general) (routine) without abnormal findings: Secondary | ICD-10-CM | POA: Diagnosis not present

## 2017-08-11 DIAGNOSIS — Z8739 Personal history of other diseases of the musculoskeletal system and connective tissue: Secondary | ICD-10-CM

## 2017-08-11 DIAGNOSIS — N951 Menopausal and female climacteric states: Secondary | ICD-10-CM

## 2017-08-11 NOTE — Progress Notes (Signed)
71 y.o. G71P1001 Married  Caucasian Fe here for annual exam. Menopausal no HRT. Denies vaginal bleeding or vaginal dryness.No more urinary issues after bladder suspension with Dr. Quincy Simmonds.  Happy with outcome. Sees PCP for aex, labs/osteoporosis management. Sees Neurology for essential  Tremors ( which has not increased) management.Linus Mako everyday and works on maintaining good diet. Recent basal cancer removed from right lower leg, no issues with. No health issues today. Planning trip to beach .   No LMP recorded. Patient has had a hysterectomy.          Sexually active: Yes.    The current method of family planning is status post hysterectomy.    Exercising: Yes.    walking Smoker:  no  Health Maintenance: Pap:  05-08-09 neg History of Abnormal Pap: no MMG:  09-02-16 category d density birads 1:neg Self Breast exams: yes Colonoscopy:  6/14 neg BMD:   2016 TDaP:  2015 or 2017 Shingles: had done Pneumonia: no Hep C and HIV: not done Labs: no   reports that she has never smoked. She has never used smokeless tobacco. She reports that she does not drink alcohol or use drugs.  Past Medical History:  Diagnosis Date  . Allergy   . Arthritis    osteoarthritis  . Endometriosis   . Fibromyalgia   . NGB (new growth of bladder)    Noted at routine cystoscopy during prolapse surgery - April 2017.  Urology evaluationn benign and no biopsy needed.  . Osteoporosis   . Osteoporosis, unspecified 06/04/2014   PCP management with medication  . PONV (postoperative nausea and vomiting)   . 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 ANTERIOR AND POSTERIOR REPAIR N/A 02/17/2016   Procedure: ANTERIOR (CYSTOCELE) AND POSTERIOR REPAIR (RECTOCELE); Enterocele repair and lysis of adhesions;  Surgeon: Nunzio Cobbs, MD;  Location: [facility];  Service: Gynecology;  Laterality: N/A;  . APPENDECTOMY    . APPENDECTOMY    . BLADDER SUSPENSION N/A  02/17/2016   Procedure: TRANSVAGINAL TAPE (TVT) PROCEDURE exact midurethral sling;  Surgeon: Nunzio Cobbs, MD;  Location: [facility];  Service: Gynecology;  Laterality: N/A;  . CYSTO N/A 02/17/2016   Procedure: Kathrene Alu;  Surgeon: Nunzio Cobbs, MD;  Location: [facility];  Service: Gynecology;  Laterality: N/A;  . STAPEDES SURGERY     left ear  . TOTAL ABDOMINAL HYSTERECTOMY  1980's    Current Outpatient Prescriptions  Medication Sig Dispense Refill  . Calcium Carb-Cholecalciferol 600-800 MG-UNIT TABS Take 0.5 tablets by mouth 2 (two) times daily.    . Cholecalciferol (VITAMIN D) 2000 units tablet Take 2,000 Units by mouth daily.    Marland Kitchen CINNAMON PO Take 1 capsule by mouth as needed.     . gabapentin (NEURONTIN) 300 MG capsule Take 300 mg by mouth 3 (three) times daily.    Marland Kitchen ibandronate (BONIVA) 150 MG tablet Take 150 mg by mouth every 30 (thirty) days. Take in the morning with a full glass of water, on an empty stomach, and do not take anything else by mouth or lie down for the next 30 min.    . irbesartan (AVAPRO) 75 MG tablet TK 1 T PO QD. REPLACES VALSARTAN  11  . Multiple Vitamin (MULTIVITAMIN) tablet Take 1 tablet by mouth daily.    . Omega-3 Fatty Acids (FISH OIL) 500 MG CAPS Take 1 capsule by mouth daily.     Marland Kitchen  triamcinolone (NASACORT AQ) 55 MCG/ACT AERO nasal inhaler Place 2 sprays into the nose daily.     No current facility-administered medications for this visit.     Family History  Problem Relation Age of Onset  . Heart disease Father   . Stroke Father   . Hypertension Father   . Alzheimer's disease Mother   . Osteoporosis Mother     ROS:  Pertinent items are noted in HPI.  Otherwise, a comprehensive ROS was negative.  Exam:   BP 120/70   Pulse 70   Resp 16   Ht 5' 4.25" (1.632 m)   Wt 142 lb (64.4 kg)   BMI 24.18 kg/m  Height: 5' 4.25" (163.2 cm) Ht Readings from Last 3 Encounters:  08/11/17 5' 4.25" (1.632 m)  11/15/16 5\' 4"  (1.626 m)  06/16/16 5'  4.25" (1.632 m)    General appearance: alert, cooperative and appears stated age Head: Normocephalic, without obvious abnormality, atraumatic Neck: no adenopathy, supple, symmetrical, trachea midline and thyroid normal to inspection and palpation Lungs: clear to auscultation bilaterally Breasts: normal appearance, no masses or tenderness, No nipple retraction or dimpling, No nipple discharge or bleeding, No axillary or supraclavicular adenopathy Heart: regular rate and rhythm Abdomen: soft, non-tender; no masses,  no organomegaly Extremities: extremities normal, atraumatic, no cyanosis or edema Skin: Skin color, texture, turgor normal. No rashes or lesions Lymph nodes: Cervical, supraclavicular, and axillary nodes normal. No abnormal inguinal nodes palpated Neurologic: Grossly normal   Pelvic: External genitalia:  no lesions              Urethra:  normal appearing urethra with no masses, tenderness or lesions              Bartholin's and Skene's: normal                 Vagina: normal appearing vagina with normal color and discharge, no lesions              Cervix: absent              Pap taken: No. Bimanual Exam:  Uterus:  uterus absent              Adnexa: normal adnexa and no mass, fullness, tenderness               Rectovaginal: Confirms               Anus:  normal sphincter tone, no lesions, large hemorrhoidal tag no change in appearance  Chaperone present: yes  A:  Well Woman with normal exam  Menopausal no HRT S/P TAH for endometriosis, ovaries retained  Osteoporosis, essential tremors with MD management  P:   Reviewed health and wellness pertinent to exam  Aware of need to notify if vaginal bleeding  Continue follow up with MD as indicated  Pap smear: no   counseled on breast self exam, mammography screening, feminine hygiene, adequate intake of calcium and vitamin D, diet and exercise  return annually or prn  An After Visit Summary was printed and given to the  patient.

## 2017-08-11 NOTE — Patient Instructions (Signed)

## 2017-09-05 ENCOUNTER — Ambulatory Visit
Admission: RE | Admit: 2017-09-05 | Discharge: 2017-09-05 | Disposition: A | Discharge status: Home / Self Care | Payer: Medicare Other | Source: Ambulatory Visit | Attending: Internal Medicine | Admitting: Internal Medicine

## 2017-09-05 DIAGNOSIS — Z1231 Encounter for screening mammogram for malignant neoplasm of breast: Secondary | ICD-10-CM

## 2017-09-19 ENCOUNTER — Emergency Department (HOSPITAL_BASED_OUTPATIENT_CLINIC_OR_DEPARTMENT_OTHER): Payer: Medicare Other

## 2017-09-19 ENCOUNTER — Encounter (HOSPITAL_BASED_OUTPATIENT_CLINIC_OR_DEPARTMENT_OTHER): Payer: Self-pay | Admitting: Emergency Medicine

## 2017-09-19 ENCOUNTER — Other Ambulatory Visit: Payer: Self-pay

## 2017-09-19 ENCOUNTER — Emergency Department (HOSPITAL_BASED_OUTPATIENT_CLINIC_OR_DEPARTMENT_OTHER)
Admission: EM | Admit: 2017-09-19 | Discharge: 2017-09-19 | Disposition: A | Discharge status: Home / Self Care | Payer: Medicare Other | Attending: Emergency Medicine | Admitting: Emergency Medicine

## 2017-09-19 DIAGNOSIS — Z79899 Other long term (current) drug therapy: Secondary | ICD-10-CM | POA: Insufficient documentation

## 2017-09-19 DIAGNOSIS — R05 Cough: Secondary | ICD-10-CM | POA: Diagnosis present

## 2017-09-19 DIAGNOSIS — R059 Cough, unspecified: Secondary | ICD-10-CM

## 2017-09-19 NOTE — Discharge Instructions (Signed)
Today your chest x-ray did not show any pneumonia.  It did show that your lungs appear to be hyperinflated which may be consistent with COPD.  Please follow-up with your primary care provider about this.  I recommend that you start taking your allergy medicine again, and use a cool air humidifier to add humidity to your house.  If you start getting fevers, chest pain, shortness of breath, or worsening symptoms please make sure that you promptly seek additional medical care.  Please schedule an appointment with your primary care doctor in the next few days for evaluation.

## 2017-09-19 NOTE — ED Triage Notes (Signed)
Reports cough x 1 month.

## 2017-09-19 NOTE — ED Provider Notes (Signed)
[facility] Provider Note   CSN: [id number] Arrival date & time: 09/19/17  1050     History   Chief Complaint Chief Complaint  Patient presents with  . Cough    HPI [name] is a 71 y.o. female with a history of fibromyalgia, neck tremor, who presents today for evaluation of dry cough times approximately 1 month.  She denies any fevers, says that she has been taking over-the-counter cough medicine as needed.  She says that around when her symptoms started she had recently started using the heat, and stopped taking her allergy medicines as she felt like allergy season was over.  No shortness of breath, chest pain, or other complaints.  HPI  Past Medical History:  Diagnosis Date  . Allergy   . Arthritis    osteoarthritis  . Endometriosis   . Fibromyalgia   . NGB (new growth of bladder)    Noted at routine cystoscopy during prolapse surgery - April 2017.  Urology evaluationn benign and no biopsy needed.  . Osteoporosis   . Osteoporosis, unspecified 06/04/2014   PCP management with medication  . PONV (postoperative nausea and vomiting)   . 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 Status post surgery 02/17/2016  . Osteoporosis, unspecified 06/04/2014    Class: History of    Past Surgical History:  Procedure Laterality Date  . APPENDECTOMY    . APPENDECTOMY    . STAPEDES SURGERY     left ear  . TOTAL ABDOMINAL HYSTERECTOMY  1980's    OB History    Gravida Para Term Preterm AB Living   1 1 1     1    SAB TAB Ectopic Multiple Live Births                   Home Medications    Prior to Admission medications   Medication Sig Start Date End Date Taking? Authorizing Provider  Calcium Carb-Cholecalciferol 600-800 MG-UNIT TABS Take 0.5 tablets by mouth 2 (two) times daily.    [provider]  Cholecalciferol (VITAMIN D) 2000 units tablet Take 2,000 Units by mouth  daily.    [provider]  CINNAMON PO Take 1 capsule by mouth as needed.     [provider]  gabapentin (NEURONTIN) 300 MG capsule Take 300 mg by mouth 3 (three) times daily.    [provider]  ibandronate (BONIVA) 150 MG tablet Take 150 mg by mouth every 30 (thirty) days. Take in the morning with a full glass of water, on an empty stomach, and do not take anything else by mouth or lie down for the next 30 min.    [provider]  irbesartan (AVAPRO) 75 MG tablet TK 1 T PO QD. REPLACES VALSARTAN 06/06/17   [provider]  Multiple Vitamin (MULTIVITAMIN) tablet Take 1 tablet by mouth daily.    [provider]  Omega-3 Fatty Acids (FISH OIL) 500 MG CAPS Take 1 capsule by mouth daily.     [provider]  triamcinolone (NASACORT AQ) 55 MCG/ACT AERO nasal inhaler Place 2 sprays into the nose daily.    [provider]    Family History Family History  Problem Relation Age of Onset  . Heart disease Father   . Stroke Father   . Hypertension Father   . Alzheimer's disease Mother   . Osteoporosis Mother  Social History Social History   Tobacco Use  . Smoking status: Never Smoker  . Smokeless tobacco: Never Used  Substance Use Topics  . Alcohol use: No    Alcohol/week: 0.0 oz  . Drug use: No     Allergies   Amoxicillin and Eggs or egg-derived products   Review of Systems Review of Systems  Constitutional: Negative for chills and fever.  HENT: Negative for congestion, postnasal drip, rhinorrhea and sore throat.   Respiratory: Positive for cough. Negative for chest tightness, shortness of breath and wheezing.   Cardiovascular: Negative for chest pain, palpitations and leg swelling.     Physical Exam Updated Vital Signs BP 130/74 (BP Location: Left Arm)   Pulse 77   Temp 98.1 F (36.7 C) (Oral)   Resp 18   Ht 5\' 5"  (1.651 m)   Wt 63.5 kg (140 lb)   SpO2 98%   BMI 23.30 kg/m   Physical Exam    Constitutional: She appears well-developed and well-nourished.  HENT:  Head: Normocephalic and atraumatic.  Right Ear: Tympanic membrane, external ear and ear canal normal.  Left Ear: Tympanic membrane, external ear and ear canal normal.  Nose: Nose normal.  Mouth/Throat: Uvula is midline, oropharynx is clear and moist and mucous membranes are normal. No oropharyngeal exudate, posterior oropharyngeal edema, posterior oropharyngeal erythema or tonsillar abscesses. No tonsillar exudate.  Eyes: Conjunctivae are normal. No scleral icterus.  Neck: Trachea normal and normal range of motion. Neck supple. No JVD present. No tracheal deviation present.  Cardiovascular: Normal rate, regular rhythm, normal heart sounds and intact distal pulses.  No leg swelling  Pulmonary/Chest: Effort normal and breath sounds normal. No accessory muscle usage or stridor. No tachypnea. No respiratory distress. She has no decreased breath sounds. She has no wheezes. She has no rales.  Lymphadenopathy:    She has no cervical adenopathy.  Skin: She is not diaphoretic.  Nursing note and vitals reviewed.    ED Treatments / Results  Labs (all labs ordered are listed, but only abnormal results are displayed) Labs Reviewed - No data to display  EKG  EKG Interpretation None       Radiology Dg Chest 2 View  Result Date: 09/19/2017 CLINICAL DATA:  Cough, congestion for 1 month EXAM: CHEST  2 VIEW COMPARISON:  11/15/2016 FINDINGS: The lungs are hyperinflated likely secondary to COPD. There is no focal parenchymal opacity. There is no pleural effusion or pneumothorax. The heart and mediastinal contours are unremarkable. The osseous structures are unremarkable. IMPRESSION: No active cardiopulmonary disease. Electronically Signed   By: Kathreen Devoid   On: 09/19/2017 11:16    Procedures Procedures (including critical care time)  Medications Ordered in ED Medications - No data to display   Initial Impression /  Assessment and Plan / ED Course  I have reviewed the triage vital signs and the nursing notes.  Pertinent labs & imaging results that were available during my care of the patient were reviewed by me and considered in my medical decision making (see chart for details).    [name] presents today for evaluation of a cough times approximately 3 weeks.  Her cough is dry.  She has not had any fevers or chills.  She does not feel short of breath.  Lung sounds clear to auscultation bilaterally, chest x-ray shows mild hyper infiltration.  Patient does not have a history of smoking or exposure to secondhand smoke.  Low suspicion for PE as other causes for her cough  are more probable.  I do not feel like this represents an acute exacerbation of COPD due to lack of smoking history, nonproductive cough.  I suspect that her cough is secondary to dry air as she started using the heat in her house, along with allergies that she stopped taking her allergy medicine shortly before the symptoms started.  Instructed to resume allergy medicines, humidified air, and follow-up with PCP regarding hyperinflation, and cough.    Final Clinical Impressions(s) / ED Diagnoses   Final diagnoses:  Cough    ED Discharge Orders    None       Lorin Glass, Hershal Coria 09/19/17 1245    Tegeler, Gwenyth Allegra, MD 09/19/17 2019

## 2018-03-24 ENCOUNTER — Other Ambulatory Visit: Payer: Self-pay | Admitting: Internal Medicine

## 2018-03-24 DIAGNOSIS — E785 Hyperlipidemia, unspecified: Secondary | ICD-10-CM

## 2018-04-06 ENCOUNTER — Ambulatory Visit
Admission: RE | Admit: 2018-04-06 | Discharge: 2018-04-06 | Disposition: A | Discharge status: Home / Self Care | Payer: Medicare Other | Source: Ambulatory Visit | Attending: Internal Medicine | Admitting: Internal Medicine

## 2018-04-06 DIAGNOSIS — E785 Hyperlipidemia, unspecified: Secondary | ICD-10-CM

## 2018-08-07 ENCOUNTER — Other Ambulatory Visit: Payer: Self-pay | Admitting: Internal Medicine

## 2018-08-07 DIAGNOSIS — Z1231 Encounter for screening mammogram for malignant neoplasm of breast: Secondary | ICD-10-CM

## 2018-08-16 ENCOUNTER — Ambulatory Visit: Payer: Medicare Other | Admitting: Certified Nurse Midwife

## 2018-09-01 ENCOUNTER — Ambulatory Visit: Payer: Medicare Other | Admitting: Certified Nurse Midwife

## 2018-09-01 ENCOUNTER — Encounter: Payer: Self-pay | Admitting: Certified Nurse Midwife

## 2018-09-01 NOTE — Progress Notes (Deleted)
72 y.o. G1P1001 Married  {Race/ethnicity:17218} Fe here for annual exam.    No LMP recorded. Patient has had a hysterectomy.          Sexually active: {yes no:314532}  The current method of family planning is status post hysterectomy.    Exercising: {yes no:314532}  {types:19826} Smoker:  {YES NO:22349}  ROS  Health Maintenance: Pap:  05-08-09 neg History of Abnormal Pap: {YES NO:22349} MMG:  09-05-17 category c density birads 1:neg Self Breast exams: {YES NO:22349} Colonoscopy:  6/14 neg BMD:   2016 TDaP:  2015  Shingles: 2019 Pneumonia: *** Hep C and HIV: not done Labs: ***   reports that she has never smoked. She has never used smokeless tobacco. She reports that she does not drink alcohol or use drugs.  Past Medical History:  Diagnosis Date  . Allergy   . Arthritis    osteoarthritis  . Endometriosis   . Fibromyalgia   . NGB (new growth of bladder)    Noted at routine cystoscopy during prolapse surgery - April 2017.  Urology evaluationn benign and no biopsy needed.  . Osteoporosis   . Osteoporosis, unspecified 06/04/2014   PCP management with medication  . PONV (postoperative nausea and vomiting)   . 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 ANTERIOR AND POSTERIOR REPAIR N/A 02/17/2016   Procedure: ANTERIOR (CYSTOCELE) AND POSTERIOR REPAIR (RECTOCELE); Enterocele repair and lysis of adhesions;  Surgeon: Nunzio Cobbs, MD;  Location: [facility];  Service: Gynecology;  Laterality: N/A;  . APPENDECTOMY    . APPENDECTOMY    . BLADDER SUSPENSION N/A 02/17/2016   Procedure: TRANSVAGINAL TAPE (TVT) PROCEDURE exact midurethral sling;  Surgeon: Nunzio Cobbs, MD;  Location: [facility];  Service: Gynecology;  Laterality: N/A;  . CYSTO N/A 02/17/2016   Procedure: Kathrene Alu;  Surgeon: Nunzio Cobbs, MD;  Location: [facility];  Service: Gynecology;  Laterality: N/A;  . STAPEDES SURGERY     left ear  . TOTAL  ABDOMINAL HYSTERECTOMY  1980's    Current Outpatient Medications  Medication Sig Dispense Refill  . Calcium Carb-Cholecalciferol 600-800 MG-UNIT TABS Take 0.5 tablets by mouth 2 (two) times daily.    . Cholecalciferol (VITAMIN D) 2000 units tablet Take 2,000 Units by mouth daily.    Marland Kitchen CINNAMON PO Take 1 capsule by mouth as needed.     . gabapentin (NEURONTIN) 300 MG capsule Take 300 mg by mouth 3 (three) times daily.    Marland Kitchen ibandronate (BONIVA) 150 MG tablet Take 150 mg by mouth every 30 (thirty) days. Take in the morning with a full glass of water, on an empty stomach, and do not take anything else by mouth or lie down for the next 30 min.    . irbesartan (AVAPRO) 75 MG tablet TK 1 T PO QD. REPLACES VALSARTAN  11  . Multiple Vitamin (MULTIVITAMIN) tablet Take 1 tablet by mouth daily.    . Omega-3 Fatty Acids (FISH OIL) 500 MG CAPS Take 1 capsule by mouth daily.     Marland Kitchen triamcinolone (NASACORT AQ) 55 MCG/ACT AERO nasal inhaler Place 2 sprays into the nose daily.     No current facility-administered medications for this visit.     Family History  Problem Relation Age of Onset  . Heart disease Father   . Stroke Father   . Hypertension Father   . Alzheimer's disease Mother   .  Osteoporosis Mother     ROS:  Pertinent items are noted in HPI.  Otherwise, a comprehensive ROS was negative.  Exam:   There were no vitals taken for this visit.   Ht Readings from Last 3 Encounters:  09/19/17 5\' 5"  (1.651 m)  08/11/17 5' 4.25" (1.632 m)  11/15/16 5\' 4"  (1.626 m)    General appearance: alert, cooperative and appears stated age Head: Normocephalic, without obvious abnormality, atraumatic Neck: no adenopathy, supple, symmetrical, trachea midline and thyroid {EXAM; THYROID:18604} Lungs: clear to auscultation bilaterally Breasts: {Exam; breast:13139::"normal appearance, no masses or tenderness"} Heart: regular rate and rhythm Abdomen: soft, non-tender; no masses,  no organomegaly Extremities:  extremities normal, atraumatic, no cyanosis or edema Skin: Skin color, texture, turgor normal. No rashes or lesions Lymph nodes: Cervical, supraclavicular, and axillary nodes normal. No abnormal inguinal nodes palpated Neurologic: Grossly normal   Pelvic: External genitalia:  no lesions              Urethra:  normal appearing urethra with no masses, tenderness or lesions              Bartholin's and Skene's: normal                 Vagina: normal appearing vagina with normal color and discharge, no lesions              Cervix: {exam; cervix:14595}              Pap taken: {yes no:314532} Bimanual Exam:  Uterus:  {exam; uterus:12215}              Adnexa: {exam; adnexa:12223}               Rectovaginal: Confirms               Anus:  normal sphincter tone, no lesions  Chaperone present: ***  A:  Well Woman with normal exam  P:   Reviewed health and wellness pertinent to exam  Pap smear: {YES NO:22349}  {plan; gyn:5269::"mammogram","pap smear","return annually or prn"}  An After Visit Summary was printed and given to the patient.

## 2018-09-11 ENCOUNTER — Ambulatory Visit: Payer: Medicare Other

## 2018-09-11 ENCOUNTER — Ambulatory Visit
Admission: RE | Admit: 2018-09-11 | Discharge: 2018-09-11 | Disposition: A | Discharge status: Home / Self Care | Payer: Medicare Other | Source: Ambulatory Visit | Attending: Internal Medicine | Admitting: Internal Medicine

## 2018-09-11 DIAGNOSIS — Z1231 Encounter for screening mammogram for malignant neoplasm of breast: Secondary | ICD-10-CM

## 2018-10-02 NOTE — Progress Notes (Signed)
72 y.o. G63P1001 Married  Caucasian Fe here for annual exam. Post menopausal, no vaginal dryness or bleeding. Sees PCP Dr. Joylene Draft yearly for labs and aex/BMD (in office), cholesterol, vitamin D and Hypertension management. All normal/stable per patient. Has been diagnosed with COPD ( second hand smoke). Tremors same, no change. Staying busy with family. No health issues today.  No LMP recorded. Patient has had a hysterectomy.          Sexually active: Yes.    The current method of family planning is status post hysterectomy.    Exercising: Yes.    walking Smoker:  no  Review of Systems  Constitutional: Negative.   HENT: Negative.   Eyes: Negative.   Respiratory: Negative.   Cardiovascular: Negative.   Gastrointestinal: Negative.   Genitourinary: Negative.   Musculoskeletal: Negative.   Skin: Negative.   Neurological: Negative.   Endo/Heme/Allergies: Negative.   Psychiatric/Behavioral: Negative.     Health Maintenance: Pap:  05-08-09 neg History of Abnormal Pap: no MMG:  09-11-18 category c density birads 1:neg Self Breast exams: yes Colonoscopy:  2014 neg BMD:   2019 TDaP:  2015 Shingles: 2019 Pneumonia: not done Hep C and HIV: not done Labs: with PCP   reports that she has never smoked. She has never used smokeless tobacco. She reports that she does not drink alcohol or use drugs.  Past Medical History:  Diagnosis Date  . Allergy   . Arthritis    osteoarthritis  . Endometriosis   . Fibromyalgia   . NGB (new growth of bladder)    Noted at routine cystoscopy during prolapse surgery - April 2017.  Urology evaluationn benign and no biopsy needed.  . Osteoporosis   . Osteoporosis, unspecified 06/04/2014   PCP management with medication  . PONV (postoperative nausea and vomiting)   . Skin cancer   . 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 ANTERIOR AND POSTERIOR REPAIR N/A 02/17/2016   Procedure: ANTERIOR  (CYSTOCELE) AND POSTERIOR REPAIR (RECTOCELE); Enterocele repair and lysis of adhesions;  Surgeon: Nunzio Cobbs, MD;  Location: [facility];  Service: Gynecology;  Laterality: N/A;  . APPENDECTOMY    . APPENDECTOMY    . BLADDER SUSPENSION N/A 02/17/2016   Procedure: TRANSVAGINAL TAPE (TVT) PROCEDURE exact midurethral sling;  Surgeon: Nunzio Cobbs, MD;  Location: [facility];  Service: Gynecology;  Laterality: N/A;  . CYSTO N/A 02/17/2016   Procedure: Kathrene Alu;  Surgeon: Nunzio Cobbs, MD;  Location: [facility];  Service: Gynecology;  Laterality: N/A;  . STAPEDES SURGERY     left ear  . TOTAL ABDOMINAL HYSTERECTOMY  1980's    Current Outpatient Medications  Medication Sig Dispense Refill  . Calcium Carb-Cholecalciferol 600-800 MG-UNIT TABS Take 0.5 tablets by mouth 2 (two) times daily.    . Cholecalciferol (VITAMIN D) 2000 units tablet Take 2,000 Units by mouth daily.    Marland Kitchen CINNAMON PO Take 1 capsule by mouth as needed.     . gabapentin (NEURONTIN) 300 MG capsule Take 300 mg by mouth 3 (three) times daily.    Marland Kitchen ibandronate (BONIVA) 150 MG tablet Take 150 mg by mouth every 30 (thirty) days. Take in the morning with a full glass of water, on an empty stomach, and do not take anything else by mouth or lie down for the next 30 min.    . irbesartan (AVAPRO) 75 MG tablet TK  1 T PO QD. REPLACES VALSARTAN  11  . Multiple Vitamin (MULTIVITAMIN) tablet Take 1 tablet by mouth daily.    . Omega-3 Fatty Acids (FISH OIL) 500 MG CAPS Take 1 capsule by mouth daily.     . pravastatin (PRAVACHOL) 40 MG tablet TK 1 T PO QD  6  . triamcinolone (NASACORT AQ) 55 MCG/ACT AERO nasal inhaler Place 2 sprays into the nose daily.     No current facility-administered medications for this visit.     Family History  Problem Relation Age of Onset  . Heart disease Father   . Stroke Father   . Hypertension Father   . Alzheimer's disease Mother   . Osteoporosis Mother   . Breast cancer Neg Hx      ROS:  Pertinent items are noted in HPI.  Otherwise, a comprehensive ROS was negative.  Exam:   BP 136/78   Pulse 70   Resp 16   Ht 5' 3.75" (1.619 m)   Wt 140 lb (63.5 kg)   BMI 24.22 kg/m  Height: 5' 3.75" (161.9 cm) Ht Readings from Last 3 Encounters:  10/03/18 5' 3.75" (1.619 m)  09/19/17 5\' 5"  (1.651 m)  08/11/17 5' 4.25" (1.632 m)    General appearance: alert, cooperative and appears stated age Head: Normocephalic, without obvious abnormality, atraumatic Neck: no adenopathy, supple, symmetrical, trachea midline and thyroid normal to inspection and palpation Lungs: clear to auscultation bilaterally Breasts: normal appearance, no masses or tenderness, No nipple retraction or dimpling, No nipple discharge or bleeding, No axillary or supraclavicular adenopathy Heart: regular rate and rhythm Abdomen: soft, non-tender; no masses,  no organomegaly Extremities: extremities normal, atraumatic, no cyanosis or edema Skin: Skin color, texture, turgor normal. No rashes or lesions Lymph nodes: Cervical, supraclavicular, and axillary nodes normal. No abnormal inguinal nodes palpated Neurologic: Grossly normal   Pelvic: External genitalia:  no lesions              Urethra:  normal appearing urethra with no masses, tenderness or lesions              Bartholin's and Skene's: normal                 Vagina: normal appearing vagina with normal color and discharge, no lesions              Cervix: absent              Pap taken: No. Bimanual Exam:  Uterus:  uterus absent              Adnexa: normal adnexa and no mass, fullness, tenderness               Rectovaginal: Confirms               Anus:  normal sphincter tone, hemorrhoidal tag present, no change  Chaperone present: yes  A:  Well Woman with normal exam  Post menopausal S/P TAH endometriosis with ovaries retained  Osteoporosis on medication with PCP management. Height change 1/2 in.  Benign tremor, no change with neurology  management  Hypertension, cholesterol, Vitamin D deficiency with PCP management.  Recent diagnosis of skin cancer on left forearm, scheduled for removal soon.      P:   Reviewed health and wellness pertinent to exam  Continue follow up with MD as indicated with medical issues.  Discussed will request BMD copy with height to compare, due to loss noted today. Work on regular exercise.  Continue with good Dermatology follow up with skin changes.  Pap smear: no   counseled on breast self exam, mammography screening, feminine hygiene, adequate intake of calcium and vitamin D, diet and exercise  return annually or prn  An After Visit Summary was printed and given to the patient.

## 2018-10-03 ENCOUNTER — Ambulatory Visit (INDEPENDENT_AMBULATORY_CARE_PROVIDER_SITE_OTHER): Payer: Medicare Other | Admitting: Certified Nurse Midwife

## 2018-10-03 ENCOUNTER — Other Ambulatory Visit: Payer: Self-pay

## 2018-10-03 ENCOUNTER — Encounter: Payer: Self-pay | Admitting: Certified Nurse Midwife

## 2018-10-03 VITALS — BP 136/78 | HR 70 | Resp 16 | Ht 63.75 in | Wt 140.0 lb

## 2018-10-03 DIAGNOSIS — Z780 Asymptomatic menopausal state: Secondary | ICD-10-CM | POA: Diagnosis not present

## 2018-10-03 DIAGNOSIS — N952 Postmenopausal atrophic vaginitis: Secondary | ICD-10-CM

## 2018-10-03 DIAGNOSIS — Z01419 Encounter for gynecological examination (general) (routine) without abnormal findings: Secondary | ICD-10-CM

## 2018-10-03 NOTE — Patient Instructions (Signed)

## 2019-05-28 ENCOUNTER — Telehealth: Payer: Self-pay | Admitting: Certified Nurse Midwife

## 2019-05-28 NOTE — Progress Notes (Signed)
GYNECOLOGY  VISIT   HPI: 73 y.o.   Married  Caucasian  female   G1P1001 with No LMP recorded. Patient has had a hysterectomy.   here for bleeding. Patient unsure if vaginal or from urine. Has resolved today.    Four days ago had pink in the toilet with urination.  She noticed a couple of blood spots with voiding later that afternoon.  No pain.  No urgency.  No fever, nausea, or back pain.   Never had UTI.  No hx renal stones.   Voiding well.   No vaginal discharge, burning or odor.   No recent sexual activity of vaginal treatments.  Denies problems with bleeding hemorrhoids.  No straining for BMs.  Urine dip - trace protein, moderate RBCs, positive nitrites, trace WBCs.   GYNECOLOGIC HISTORY: No LMP recorded. Patient has had a hysterectomy. Contraception:  Hysterectomy Menopausal hormone therapy:  none Last mammogram:  09/11/18 BIRADS 1 negative/density c Last pap smear:   05-08-09 neg        OB History    Gravida  1   Para  1   Term  1   Preterm      AB      Living  1     SAB      TAB      Ectopic      Multiple      Live Births                 Patient Active Problem List   Diagnosis Date Noted  . Status post surgery 02/17/2016  . Osteoporosis, unspecified 06/04/2014    Class: History of    Past Medical History:  Diagnosis Date  . Allergy   . Arthritis    osteoarthritis  . Endometriosis   . Fibromyalgia   . NGB (new growth of bladder)    Noted at routine cystoscopy during prolapse surgery - April 2017.  Urology evaluationn benign and no biopsy needed.  . Osteoporosis   . Osteoporosis, unspecified 06/04/2014   PCP management with medication  . PONV (postoperative nausea and vomiting)   . Skin cancer   . 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 ANTERIOR AND POSTERIOR REPAIR N/A 02/17/2016   Procedure: ANTERIOR (CYSTOCELE) AND POSTERIOR REPAIR (RECTOCELE); Enterocele repair and  lysis of adhesions;  Surgeon: Nunzio Cobbs, MD;  Location: [facility];  Service: Gynecology;  Laterality: N/A;  . APPENDECTOMY    . APPENDECTOMY    . BLADDER SUSPENSION N/A 02/17/2016   Procedure: TRANSVAGINAL TAPE (TVT) PROCEDURE exact midurethral sling;  Surgeon: Nunzio Cobbs, MD;  Location: [facility];  Service: Gynecology;  Laterality: N/A;  . CYSTO N/A 02/17/2016   Procedure: Kathrene Alu;  Surgeon: Nunzio Cobbs, MD;  Location: [facility];  Service: Gynecology;  Laterality: N/A;  . STAPEDES SURGERY     left ear  . TOTAL ABDOMINAL HYSTERECTOMY  1980's    Current Outpatient Medications  Medication Sig Dispense Refill  . Calcium Carb-Cholecalciferol 600-800 MG-UNIT TABS Take 0.5 tablets by mouth 2 (two) times daily.    . Cholecalciferol (VITAMIN D) 2000 units tablet Take 2,000 Units by mouth daily.    Marland Kitchen CINNAMON PO Take 1 capsule by mouth as needed.     . gabapentin (NEURONTIN) 300 MG capsule Take 300 mg by mouth 3 (three) times daily.    Marland Kitchen ibandronate (BONIVA)  150 MG tablet Take 150 mg by mouth every 30 (thirty) days. Take in the morning with a full glass of water, on an empty stomach, and do not take anything else by mouth or lie down for the next 30 min.    . irbesartan (AVAPRO) 75 MG tablet TK 1 T PO QD. REPLACES VALSARTAN  11  . Multiple Vitamin (MULTIVITAMIN) tablet Take 1 tablet by mouth daily.    . Omega-3 Fatty Acids (FISH OIL) 500 MG CAPS Take 1 capsule by mouth daily.     . pravastatin (PRAVACHOL) 40 MG tablet TK 1 T PO QD  6  . triamcinolone (NASACORT AQ) 55 MCG/ACT AERO nasal inhaler Place 2 sprays into the nose daily.     No current facility-administered medications for this visit.      ALLERGIES: Amoxicillin and Eggs or egg-derived products  Family History  Problem Relation Age of Onset  . Heart disease Father   . Stroke Father   . Hypertension Father   . Alzheimer's disease Mother   . Osteoporosis Mother   . Breast cancer Neg Hx     Social  History   Socioeconomic History  . Marital status: Married    Spouse name: Not on file  . Number of children: Not on file  . Years of education: Not on file  . Highest education level: Not on file  Occupational History  . Not on file  Social Needs  . Financial resource strain: Not on file  . Food insecurity    Worry: Not on file    Inability: Not on file  . Transportation needs    Medical: Not on file    Non-medical: Not on file  Tobacco Use  . Smoking status: Never Smoker  . Smokeless tobacco: Never Used  Substance and Sexual Activity  . Alcohol use: No    Alcohol/week: 0.0 standard drinks  . Drug use: No  . Sexual activity: Yes    Partners: Male    Birth control/protection: Surgical    Comment: TAH  Lifestyle  . Physical activity    Days per week: Not on file    Minutes per session: Not on file  . Stress: Not on file  Relationships  . Social Herbalist on phone: Not on file    Gets together: Not on file    Attends religious service: Not on file    Active member of club or organization: Not on file    Attends meetings of clubs or organizations: Not on file    Relationship status: Not on file  . Intimate partner violence    Fear of current or ex partner: Not on file    Emotionally abused: Not on file    Physically abused: Not on file    Forced sexual activity: Not on file  Other Topics Concern  . Not on file  Social History Narrative  . Not on file    Review of Systems  Constitutional: Negative.   HENT: Negative.   Eyes: Negative.   Respiratory: Negative.   Cardiovascular: Negative.   Gastrointestinal: Negative.   Endocrine: Negative.   Genitourinary:       Vaginal bleeding occurrence on 05-25-2019, has resolved today  Musculoskeletal: Negative.   Skin: Negative.   Allergic/Immunologic: Negative.   Neurological: Negative.   Hematological: Negative.   Psychiatric/Behavioral: Negative.     PHYSICAL EXAMINATION:    BP 140/70 (BP Location:  Right Arm, Patient Position: Sitting, Cuff Size: Normal)  Pulse 92   Temp (!) 97.1 F (36.2 C) (Temporal)   Resp 14   Ht 5' 3.75" (1.619 m)   Wt 135 lb (61.2 kg)   BMI 23.35 kg/m     General appearance: alert, cooperative and appears stated age    Pelvic: External genitalia:  no lesions              Urethra:  normal appearing urethra with no masses, tenderness or lesions              Bartholins and Skenes: normal                 Vagina: normal appearing vagina with normal color and discharge, no lesions              Cervix: absent                Bimanual Exam:  Uterus:   absent              Adnexa: no mass, fullness, tenderness              Anus:  Hemorrhoids.  Chaperone was present for exam.  ASSESSMENT  Microscopic hematuria.  Hemorrhoids.   PLAN  Urine micro and cx.  Will wait for results to prescribe any abx. We discussed signs and symptoms of UTI and pyelonephritis.  She will call if she starts to feel worse or has recurrent blood in urine.    An After Visit Summary was printed and given to the patient.  ___15___ minutes face to face time of which over 50% was spent in counseling.

## 2019-05-28 NOTE — Telephone Encounter (Signed)
Spoke with patient. Patient states she noticed blood in urine and when wiping on 7/17, this was after holding her uirne for longer than normal, has not happened since. Denies pelvic pain, d/c, odor, urgency, frequency, dysuria, fever/chills, flank pain, N/V. Recommended OV with MD for further evaluation, Covid19 prescreen negative, precautions reviewed. OV scheduled for 7/21 at 8am with Dr. Quincy Simmonds. Advised I will review with Dr. Quincy Simmonds and return call if any additional recommendations, patient agreeable.  Hx TAH with A&P repair 02/2016  Dr. Quincy Simmonds -ok to proceed as scheduled?

## 2019-05-28 NOTE — Telephone Encounter (Signed)
Patient is calling regarding blood in her urine. Patient stated that she was out on Saturday and did not go to the restroom as soon as she should have. Patient stated that she has not seen any blood in her urine since.

## 2019-05-28 NOTE — Telephone Encounter (Signed)
Encounter closed

## 2019-05-28 NOTE — Telephone Encounter (Signed)
[place] for office visit as scheduled.

## 2019-05-29 ENCOUNTER — Other Ambulatory Visit: Payer: Self-pay

## 2019-05-29 ENCOUNTER — Encounter: Payer: Self-pay | Admitting: Obstetrics and Gynecology

## 2019-05-29 ENCOUNTER — Ambulatory Visit (INDEPENDENT_AMBULATORY_CARE_PROVIDER_SITE_OTHER): Payer: Medicare Other | Admitting: Obstetrics and Gynecology

## 2019-05-29 VITALS — BP 140/70 | HR 92 | Temp 97.1°F | Resp 14 | Ht 63.75 in | Wt 135.0 lb

## 2019-05-29 DIAGNOSIS — R319 Hematuria, unspecified: Secondary | ICD-10-CM

## 2019-05-29 LAB — POCT URINALYSIS DIPSTICK
Bilirubin, UA: NEGATIVE
Glucose, UA: NEGATIVE
Ketones, UA: NEGATIVE
Nitrite, UA: POSITIVE
Protein, UA: POSITIVE — AB
Urobilinogen, UA: 0.2 E.U./dL
pH, UA: 5 (ref 5.0–8.0)

## 2019-05-30 LAB — URINALYSIS, MICROSCOPIC ONLY: WBC, UA: >30 /hpf — AB (ref 0–5)

## 2019-05-31 ENCOUNTER — Other Ambulatory Visit: Payer: Self-pay | Admitting: *Deleted

## 2019-05-31 LAB — URINE CULTURE

## 2019-05-31 MED ORDER — SULFAMETHOXAZOLE-TRIMETHOPRIM 800-160 MG PO TABS: 1.0000 | ORAL_TABLET | Freq: Two times a day (BID) | ORAL | 0 refills | Status: AC

## 2019-08-06 ENCOUNTER — Other Ambulatory Visit: Payer: Self-pay | Admitting: Internal Medicine

## 2019-08-06 DIAGNOSIS — Z1231 Encounter for screening mammogram for malignant neoplasm of breast: Secondary | ICD-10-CM

## 2019-09-20 ENCOUNTER — Other Ambulatory Visit: Payer: Self-pay

## 2019-09-20 ENCOUNTER — Ambulatory Visit
Admission: RE | Admit: 2019-09-20 | Discharge: 2019-09-20 | Disposition: A | Discharge status: Home / Self Care | Payer: Medicare Other | Source: Ambulatory Visit | Attending: Internal Medicine | Admitting: Internal Medicine

## 2019-09-20 DIAGNOSIS — Z1231 Encounter for screening mammogram for malignant neoplasm of breast: Secondary | ICD-10-CM

## 2019-10-09 ENCOUNTER — Encounter (HOSPITAL_BASED_OUTPATIENT_CLINIC_OR_DEPARTMENT_OTHER): Payer: Self-pay | Admitting: *Deleted

## 2019-10-09 ENCOUNTER — Emergency Department (HOSPITAL_BASED_OUTPATIENT_CLINIC_OR_DEPARTMENT_OTHER)
Admission: EM | Admit: 2019-10-09 | Discharge: 2019-10-09 | Disposition: A | Discharge status: Home / Self Care | Payer: Medicare Other | Attending: Emergency Medicine | Admitting: Emergency Medicine

## 2019-10-09 ENCOUNTER — Other Ambulatory Visit: Payer: Self-pay

## 2019-10-09 DIAGNOSIS — Z79899 Other long term (current) drug therapy: Secondary | ICD-10-CM | POA: Diagnosis not present

## 2019-10-09 DIAGNOSIS — R42 Dizziness and giddiness: Secondary | ICD-10-CM | POA: Diagnosis not present

## 2019-10-09 DIAGNOSIS — R05 Cough: Secondary | ICD-10-CM | POA: Insufficient documentation

## 2019-10-09 DIAGNOSIS — R531 Weakness: Secondary | ICD-10-CM | POA: Diagnosis present

## 2019-10-09 NOTE — ED Triage Notes (Signed)
Woke with weakness and felt like she was going to pass out. She went back to bed and when she woke up she felt fine. Her husband was tested for Covid yesterday.

## 2019-10-09 NOTE — ED Provider Notes (Addendum)
[facility] EMERGENCY DEPARTMENT Provider Note   CSN: [id number] Arrival date & time: 10/09/19  1234     History   Chief Complaint Chief Complaint  Patient presents with  . Weakness  . Cough    HPI [name] is a 73 y.o. female with history of benign tremor, fibromyalgia, osteoporosis who presents following an episode of lightheadedness after bending over to pick something up.  This happened when she first woke up and had not eaten or drink anything.  She feels back to her normal self now.  She has had an intermittent cough over the past few weeks related to her allergies, but is otherwise feeling well.  She denies any fever, chest pain, shortness of breath, abdominal pain, nausea, vomiting, urinary symptoms, vision changes, numbness or tingling.  Patient reports her husband got tested for COVID-19, but does not have any symptoms.  She has an appointment scheduled today for a test, but they just want to "make sure."     HPI  Past Medical History:  Diagnosis Date  . Allergy   . Arthritis    osteoarthritis  . Endometriosis   . Fibromyalgia   . NGB (new growth of bladder)    Noted at routine cystoscopy during prolapse surgery - April 2017.  Urology evaluationn benign and no biopsy needed.  . Osteoporosis   . Osteoporosis, unspecified 06/04/2014   PCP management with medication  . PONV (postoperative nausea and vomiting)   . Skin cancer   . 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 Status post surgery 02/17/2016  . Osteoporosis, unspecified 06/04/2014    Class: History of    Past Surgical History:  Procedure Laterality Date  . ANTERIOR AND POSTERIOR REPAIR N/A 02/17/2016   Procedure: ANTERIOR (CYSTOCELE) AND POSTERIOR REPAIR (RECTOCELE); Enterocele repair and lysis of adhesions;  Surgeon: Nunzio Cobbs, MD;  Location: [facility];  Service: Gynecology;  Laterality: N/A;  . APPENDECTOMY     . APPENDECTOMY    . BLADDER SUSPENSION N/A 02/17/2016   Procedure: TRANSVAGINAL TAPE (TVT) PROCEDURE exact midurethral sling;  Surgeon: Nunzio Cobbs, MD;  Location: [facility];  Service: Gynecology;  Laterality: N/A;  . CYSTO N/A 02/17/2016   Procedure: Kathrene Alu;  Surgeon: Nunzio Cobbs, MD;  Location: [facility];  Service: Gynecology;  Laterality: N/A;  . STAPEDES SURGERY     left ear  . TOTAL ABDOMINAL HYSTERECTOMY  1980's     OB History    Gravida  1   Para  1   Term  1   Preterm      AB      Living  1     SAB      TAB      Ectopic      Multiple      Live Births               Home Medications    Prior to Admission medications   Medication Sig Start Date End Date Taking? Authorizing Provider  Calcium Carb-Cholecalciferol 600-800 MG-UNIT TABS Take 0.5 tablets by mouth 2 (two) times daily.    [provider]  Cholecalciferol (VITAMIN D) 2000 units tablet Take 2,000 Units by mouth daily.    [provider]  CINNAMON PO Take 1 capsule by mouth as needed.     [provider]  gabapentin (NEURONTIN)  300 MG capsule Take 300 mg by mouth 3 (three) times daily.    [provider]  ibandronate (BONIVA) 150 MG tablet Take 150 mg by mouth every 30 (thirty) days. Take in the morning with a full glass of water, on an empty stomach, and do not take anything else by mouth or lie down for the next 30 min.    [provider]  irbesartan (AVAPRO) 75 MG tablet TK 1 T PO QD. REPLACES VALSARTAN 06/06/17   [provider]  Multiple Vitamin (MULTIVITAMIN) tablet Take 1 tablet by mouth daily.    [provider]  Omega-3 Fatty Acids (FISH OIL) 500 MG CAPS Take 1 capsule by mouth daily.     [provider]  pravastatin (PRAVACHOL) 40 MG tablet TK 1 T PO QD 08/09/18   [provider]  triamcinolone (NASACORT AQ) 55 MCG/ACT AERO nasal inhaler Place 2 sprays into the nose daily.    [provider]    Family History Family History  Problem Relation Age of Onset  . Heart disease Father   . Stroke Father   . Hypertension Father   . Alzheimer's disease Mother   . Osteoporosis Mother   . Breast cancer Neg Hx     Social History Social History   Tobacco Use  . Smoking status: Never Smoker  . Smokeless tobacco: Never Used  Substance Use Topics  . Alcohol use: No    Alcohol/week: 0.0 standard drinks  . Drug use: No     Allergies   Amoxicillin and Eggs or egg-derived products   Review of Systems Review of Systems  Constitutional: Negative for chills and fever.  HENT: Negative for facial swelling and sore throat.   Respiratory: Negative for shortness of breath.   Cardiovascular: Negative for chest pain.  Gastrointestinal: Negative for abdominal pain, nausea and vomiting.  Genitourinary: Negative for dysuria.  Musculoskeletal: Negative for back pain.  Skin: Negative for rash and wound.  Neurological: Positive for light-headedness. Negative for headaches.  Psychiatric/Behavioral: The patient is not nervous/anxious.      Physical Exam Updated Vital Signs BP (!) 146/79 (BP Location: Right Arm)   Pulse 68   Temp 98.6 F (37 C) (Oral)   Resp (!) 22   Ht 5' 2.5" (1.588 m)   Wt 59 kg   SpO2 96%   BMI 23.40 kg/m   Physical Exam Vitals signs and nursing note reviewed.  Constitutional:      General: She is not in acute distress.    Appearance: She is well-developed. She is not diaphoretic.  HENT:     Head: Normocephalic and atraumatic.     Mouth/Throat:     Pharynx: No oropharyngeal exudate.  Eyes:     General: No scleral icterus.       Right eye: No discharge.        Left eye: No discharge.     Extraocular Movements: Extraocular movements intact.     Conjunctiva/sclera: Conjunctivae normal.     Pupils: Pupils are equal, round, and reactive to light.  Neck:     Musculoskeletal: Normal range of motion and neck supple.     Thyroid: No thyromegaly.   Cardiovascular:     Rate and Rhythm: Normal rate and regular rhythm.     Heart sounds: Normal heart sounds. No murmur. No friction rub. No gallop.   Pulmonary:     Effort: Pulmonary effort is normal. No respiratory distress.     Breath sounds: Normal breath  sounds. No stridor. No wheezing or rales.  Abdominal:     General: Bowel sounds are normal. There is no distension.     Palpations: Abdomen is soft.     Tenderness: There is no abdominal tenderness. There is no guarding or rebound.  Lymphadenopathy:     Cervical: No cervical adenopathy.  Skin:    General: Skin is warm and dry.     Coloration: Skin is not pale.     Findings: No rash.  Neurological:     Mental Status: She is alert.     Coordination: Coordination normal.     Comments: CN 3-12 intact; normal sensation throughout; 5/5 strength in all 4 extremities; equal bilateral grip strength; no tremor noted in the neck/head      ED Treatments / Results  Labs (all labs ordered are listed, but only abnormal results are displayed) Labs Reviewed - No data to display  EKG EKG Interpretation  Date/Time:  Tuesday October 09 2019 12:50:54 EST Ventricular Rate:  86 PR Interval:    QRS Duration: 92 QT Interval:  365 QTC Calculation: 437 R Axis:   37 Text Interpretation: Sinus rhythm Minimal ST depression, anterolateral leads No significant change was found Confirmed by Gerlene Fee [phone]) on 10/09/2019 1:28:46 PM   Radiology No results found.  Procedures Procedures (including critical care time)  Medications Ordered in ED Medications - No data to display   Initial Impression / Assessment and Plan / ED Course  I have reviewed the triage vital signs and the nursing notes.  Pertinent labs & imaging results that were available during my care of the patient were reviewed by me and considered in my medical decision making (see chart for details).        Patient presenting after an episode of suspected orthostatic  lightheadedness after bending over and getting up quickly.  Patient is back to baseline and feeling her normal self.  Her EKG shows NSR no significant change since last tracing.  Vitals are stable here.  Do not feel further work-up indicated emergently at this time.  Follow-up to PCP if symptoms are continuing or return for any worsening symptoms.  Patient understands and agrees with plan.  Patient discharged in satisfactory condition.  Patient also evaluated my attending, Dr. Sedonia Small, who guided the patient's management and agrees with plan.  Final Clinical Impressions(s) / ED Diagnoses   Final diagnoses:  Orthostatic lightheadedness    ED Discharge Orders    None         Frederica Kuster, PA-C 10/09/19 1343    Maudie Flakes, MD 10/10/19 [phone]

## 2019-10-09 NOTE — ED Notes (Signed)
Patient verbalizes understanding of discharge instructions. Opportunity for questioning and answers were provided. Armband removed by staff, pt discharged from ED.  

## 2019-10-09 NOTE — Discharge Instructions (Signed)
Please follow-up with your doctor if your symptoms continue. Please return to the emergency department if you develop any new or worsening symptoms.  Make sure to stay well-hydrated.  If you have to bend over, make sure to try not to get up too quickly as this can cause your blood pressure to go down causing the feeling that you are describing.

## 2019-10-11 ENCOUNTER — Ambulatory Visit: Payer: Medicare Other | Admitting: Certified Nurse Midwife

## 2019-10-15 ENCOUNTER — Ambulatory Visit: Payer: Medicare Other | Admitting: Certified Nurse Midwife

## 2019-10-26 ENCOUNTER — Other Ambulatory Visit: Payer: Self-pay

## 2019-10-30 ENCOUNTER — Encounter: Payer: Self-pay | Admitting: Certified Nurse Midwife

## 2019-10-30 ENCOUNTER — Ambulatory Visit (INDEPENDENT_AMBULATORY_CARE_PROVIDER_SITE_OTHER): Payer: Medicare Other | Admitting: Certified Nurse Midwife

## 2019-10-30 ENCOUNTER — Other Ambulatory Visit: Payer: Self-pay

## 2019-10-30 VITALS — BP 120/75 | HR 70 | Temp 97.5°F | Resp 16 | Ht 63.25 in | Wt 134.0 lb

## 2019-10-30 DIAGNOSIS — Z01419 Encounter for gynecological examination (general) (routine) without abnormal findings: Secondary | ICD-10-CM | POA: Diagnosis not present

## 2019-10-30 NOTE — Progress Notes (Signed)
73 y.o. G42P1001 Married  Caucasian Fe here for annual exam. Post menopausal,denies vaginal dryness or bleeding. Had Covid virus 2 months ago, spouse also, recovered except for taste buds. Continues with tremor, no change. Sees PCP Dr. Abner Greenspan once yearly, had virtual visit this year. Did have labs, all stable. No change in medication for cholesterol or hypertension. Due for BMD next year, continues on Boniva with no issues. No other health issues today.  No LMP recorded. Patient has had a hysterectomy.          Sexually active: Yes.    The current method of family planning is status post hysterectomy.    Exercising: No.  exercise Smoker:  no  Review of Systems  Constitutional: Negative.   HENT: Negative.   Eyes: Negative.   Respiratory: Negative.   Cardiovascular: Negative.   Gastrointestinal: Negative.   Genitourinary: Negative.   Musculoskeletal: Negative.   Skin: Negative.   Neurological: Negative.   Endo/Heme/Allergies: Negative.   Psychiatric/Behavioral: Negative.     Health Maintenance: Pap:  05-08-09 neg History of Abnormal Pap: no MMG:  09-20-2019 category c density birads 1:neg Self Breast exams: yes Colonoscopy:  2014 neg BMD:   2019 in office with Dr. Abner Greenspan TDaP:  2015 Shingles: 2019,2020 Pneumonia: not done Hep C and HIV: not done Labs: PCP only   reports that she has never smoked. She has never used smokeless tobacco. She reports that she does not drink alcohol or use drugs.  Past Medical History:  Diagnosis Date  . Allergy   . Arthritis    osteoarthritis  . Endometriosis   . Fibromyalgia   . NGB (new growth of bladder)    Noted at routine cystoscopy during prolapse surgery - April 2017.  Urology evaluationn benign and no biopsy needed.  . Osteoporosis   . Osteoporosis, unspecified 06/04/2014   PCP management with medication  . PONV (postoperative nausea and vomiting)   . Skin cancer   . Tremor    benign tremor mainly in neck    Past Surgical  History:  Procedure Laterality Date  . ANTERIOR AND POSTERIOR REPAIR N/A 02/17/2016   Procedure: ANTERIOR (CYSTOCELE) AND POSTERIOR REPAIR (RECTOCELE); Enterocele repair and lysis of adhesions;  Surgeon: Nunzio Cobbs, MD;  Location: [facility];  Service: Gynecology;  Laterality: N/A;  . APPENDECTOMY    . APPENDECTOMY    . BLADDER SUSPENSION N/A 02/17/2016   Procedure: TRANSVAGINAL TAPE (TVT) PROCEDURE exact midurethral sling;  Surgeon: Nunzio Cobbs, MD;  Location: [facility];  Service: Gynecology;  Laterality: N/A;  . CYSTO N/A 02/17/2016   Procedure: Kathrene Alu;  Surgeon: Nunzio Cobbs, MD;  Location: [facility];  Service: Gynecology;  Laterality: N/A;  . STAPEDES SURGERY     left ear  . TOTAL ABDOMINAL HYSTERECTOMY  1980's    Current Outpatient Medications  Medication Sig Dispense Refill  . Calcium Carb-Cholecalciferol 600-800 MG-UNIT TABS Take 0.5 tablets by mouth 2 (two) times daily.    . Cholecalciferol (VITAMIN D) 2000 units tablet Take 2,000 Units by mouth daily.    Marland Kitchen CINNAMON PO Take 1 capsule by mouth as needed.     . Coenzyme Q10 (CO Q 10 PO) Take by mouth.    . gabapentin (NEURONTIN) 300 MG capsule Take 300 mg by mouth 3 (three) times daily.    Marland Kitchen ibandronate (BONIVA) 150 MG tablet Take 150 mg by mouth every 30 (thirty) days. Take in the morning with a full glass of  water, on an empty stomach, and do not take anything else by mouth or lie down for the next 30 min.    . irbesartan (AVAPRO) 75 MG tablet TK 1 T PO QD. REPLACES VALSARTAN  11  . Multiple Vitamin (MULTIVITAMIN) tablet Take 1 tablet by mouth daily.    . Omega-3 Fatty Acids (FISH OIL) 500 MG CAPS Take 1 capsule by mouth daily.     . pravastatin (PRAVACHOL) 40 MG tablet TK 1 T PO QD  6  . triamcinolone (NASACORT AQ) 55 MCG/ACT AERO nasal inhaler Place 2 sprays into the nose daily.     No current facility-administered medications for this visit.    Family History  Problem Relation Age of Onset  .  Heart disease Father   . Stroke Father   . Hypertension Father   . Alzheimer's disease Mother   . Osteoporosis Mother   . Breast cancer Neg Hx     ROS:  Pertinent items are noted in HPI.  Otherwise, a comprehensive ROS was negative.  Exam:   BP 120/75   Pulse 70   Temp (!) 97.5 F (36.4 C) (Skin)   Resp 16   Ht 5' 3.25" (1.607 m)   Wt 134 lb (60.8 kg)   BMI 23.55 kg/m  Height: 5' 3.25" (160.7 cm) Ht Readings from Last 3 Encounters:  10/30/19 5' 3.25" (1.607 m)  10/09/19 5' 2.5" (1.588 m)  05/29/19 5' 3.75" (1.619 m)    General appearance: alert, cooperative and appears stated age Head: Normocephalic, without obvious abnormality, atraumatic Neck: no adenopathy, supple, symmetrical, trachea midline and thyroid normal to inspection and palpation Lungs: clear to auscultation bilaterally Breasts: normal appearance, no masses or tenderness, No nipple retraction or dimpling, No nipple discharge or bleeding, No axillary or supraclavicular adenopathy, mostly fatty appearance bilateral Heart: regular rate and rhythm Abdomen: soft, non-tender; no masses,  no organomegaly Extremities: extremities normal, atraumatic, no cyanosis or edema Skin: Skin color, texture, turgor normal. No rashes or lesions Lymph nodes: Cervical, supraclavicular, and axillary nodes normal. No abnormal inguinal nodes palpated Neurologic: Grossly normal   Pelvic: External genitalia:  no lesions              Urethra:  normal appearing urethra with no masses, tenderness or lesions              Bartholin's and Skene's: normal                 Vagina: normal appearing vagina with normal color and discharge, no lesions              Cervix: absent              Pap taken: No. Bimanual Exam:  Uterus:  uterus absent              Adnexa: no mass, fullness, tenderness and adnexa absent bilateral               Rectovaginal: Confirms               Anus:  normal sphincter tone, no lesions  Chaperone present: yes  A:   Well Woman with normal exam  Post menopausal s/pTAH with BSO  History of osteoporosis with height change today, on Boniva with PCP management  History of tremor with no change  Hypertension/cholesterol management with PCP  History of Covid 19 infection, recovered with spouse, taste buds still absent  P:   Reviewed health and wellness pertinent to exam  Discussed importance of advising if vaginal dryness or urinary incontinence.  Continue follow up with PCP as indicated with medical issues and osteoporosis.  Pap smear: no   counseled on breast self exam, mammography screening, feminine hygiene, adequate intake of calcium and vitamin D, diet and exercise  return annually or prn  An After Visit Summary was printed and given to the patient.

## 2019-10-30 NOTE — Patient Instructions (Signed)

## 2020-01-30 ENCOUNTER — Encounter: Payer: Self-pay | Admitting: Certified Nurse Midwife

## 2020-02-19 ENCOUNTER — Ambulatory Visit (HOSPITAL_COMMUNITY)
Admission: RE | Admit: 2020-02-19 | Discharge: 2020-02-19 | Disposition: A | Discharge status: Home / Self Care | Payer: Medicare Other | Source: Ambulatory Visit | Attending: Vascular Surgery | Admitting: Vascular Surgery

## 2020-02-19 ENCOUNTER — Other Ambulatory Visit: Payer: Self-pay

## 2020-02-19 ENCOUNTER — Other Ambulatory Visit (HOSPITAL_COMMUNITY): Payer: Self-pay | Admitting: Internal Medicine

## 2020-02-19 DIAGNOSIS — M79604 Pain in right leg: Secondary | ICD-10-CM | POA: Diagnosis not present

## 2020-05-09 ENCOUNTER — Other Ambulatory Visit: Payer: Self-pay | Admitting: Internal Medicine

## 2020-05-09 DIAGNOSIS — R413 Other amnesia: Secondary | ICD-10-CM

## 2020-06-16 ENCOUNTER — Other Ambulatory Visit: Payer: Medicare Other

## 2020-06-23 ENCOUNTER — Other Ambulatory Visit: Payer: Medicare Other

## 2020-06-28 ENCOUNTER — Other Ambulatory Visit: Payer: Medicare Other

## 2020-07-11 ENCOUNTER — Other Ambulatory Visit: Payer: Self-pay

## 2020-07-11 ENCOUNTER — Ambulatory Visit
Admission: RE | Admit: 2020-07-11 | Discharge: 2020-07-11 | Disposition: A | Discharge status: Home / Self Care | Payer: Medicare Other | Source: Ambulatory Visit | Attending: Internal Medicine | Admitting: Internal Medicine

## 2020-07-11 DIAGNOSIS — R413 Other amnesia: Secondary | ICD-10-CM

## 2020-07-18 ENCOUNTER — Other Ambulatory Visit: Payer: Self-pay | Admitting: Internal Medicine

## 2020-07-18 DIAGNOSIS — M810 Age-related osteoporosis without current pathological fracture: Secondary | ICD-10-CM

## 2020-07-19 ENCOUNTER — Other Ambulatory Visit: Payer: Medicare Other

## 2020-08-11 ENCOUNTER — Other Ambulatory Visit: Payer: Self-pay

## 2020-08-11 ENCOUNTER — Ambulatory Visit
Admission: RE | Admit: 2020-08-11 | Discharge: 2020-08-11 | Disposition: A | Discharge status: Home / Self Care | Payer: Medicare Other | Source: Ambulatory Visit | Attending: Internal Medicine | Admitting: Internal Medicine

## 2020-08-11 DIAGNOSIS — M810 Age-related osteoporosis without current pathological fracture: Secondary | ICD-10-CM

## 2020-08-12 ENCOUNTER — Ambulatory Visit
Admission: RE | Admit: 2020-08-12 | Discharge: 2020-08-12 | Disposition: A | Discharge status: Home / Self Care | Payer: Medicare Other | Source: Ambulatory Visit | Attending: Internal Medicine | Admitting: Internal Medicine

## 2020-08-18 ENCOUNTER — Other Ambulatory Visit: Payer: Self-pay | Admitting: Internal Medicine

## 2020-08-18 DIAGNOSIS — Z1231 Encounter for screening mammogram for malignant neoplasm of breast: Secondary | ICD-10-CM

## 2020-09-21 ENCOUNTER — Other Ambulatory Visit: Payer: Self-pay

## 2020-09-21 ENCOUNTER — Emergency Department (HOSPITAL_BASED_OUTPATIENT_CLINIC_OR_DEPARTMENT_OTHER)
Admission: EM | Admit: 2020-09-21 | Discharge: 2020-09-21 | Disposition: A | Discharge status: Home / Self Care | Payer: Medicare Other | Attending: Emergency Medicine | Admitting: Emergency Medicine

## 2020-09-21 ENCOUNTER — Encounter (HOSPITAL_BASED_OUTPATIENT_CLINIC_OR_DEPARTMENT_OTHER): Payer: Self-pay | Admitting: Emergency Medicine

## 2020-09-21 ENCOUNTER — Emergency Department (HOSPITAL_BASED_OUTPATIENT_CLINIC_OR_DEPARTMENT_OTHER): Payer: Medicare Other

## 2020-09-21 DIAGNOSIS — Z9089 Acquired absence of other organs: Secondary | ICD-10-CM | POA: Insufficient documentation

## 2020-09-21 DIAGNOSIS — Z90710 Acquired absence of both cervix and uterus: Secondary | ICD-10-CM | POA: Insufficient documentation

## 2020-09-21 DIAGNOSIS — C439 Malignant melanoma of skin, unspecified: Secondary | ICD-10-CM | POA: Insufficient documentation

## 2020-09-21 DIAGNOSIS — K5900 Constipation, unspecified: Secondary | ICD-10-CM | POA: Diagnosis not present

## 2020-09-21 DIAGNOSIS — N3090 Cystitis, unspecified without hematuria: Secondary | ICD-10-CM | POA: Insufficient documentation

## 2020-09-21 DIAGNOSIS — R110 Nausea: Secondary | ICD-10-CM | POA: Diagnosis present

## 2020-09-21 LAB — URINALYSIS, MICROSCOPIC (REFLEX)

## 2020-09-21 LAB — COMPREHENSIVE METABOLIC PANEL
ALT: 16 U/L (ref 0–44)
AST: 17 U/L (ref 15–41)
Albumin: 4.2 g/dL (ref 3.5–5.0)
Alkaline Phosphatase: 51 U/L (ref 38–126)
Anion gap: 10 (ref 5–15)
BUN: 7 mg/dL — ABNORMAL LOW (ref 8–23)
CO2: 29 mmol/L (ref 22–32)
Calcium: 9.4 mg/dL (ref 8.9–10.3)
Chloride: 102 mmol/L (ref 98–111)
Creatinine, Ser: 0.57 mg/dL (ref 0.44–1.00)
GFR, Estimated: >60 mL/min (ref >60)
Glucose, Bld: 111 mg/dL — ABNORMAL HIGH (ref 70–99)
Potassium: 4 mmol/L (ref 3.5–5.1)
Sodium: 141 mmol/L (ref 135–145)
Total Bilirubin: 1 mg/dL (ref 0.3–1.2)
Total Protein: 7.2 g/dL (ref 6.5–8.1)

## 2020-09-21 LAB — URINALYSIS, ROUTINE W REFLEX MICROSCOPIC
Bilirubin Urine: NEGATIVE
Glucose, UA: NEGATIVE mg/dL
Ketones, ur: NEGATIVE mg/dL
Nitrite: POSITIVE — AB
Protein, ur: NEGATIVE mg/dL
Specific Gravity, Urine: 1.02 (ref 1.005–1.030)
pH: 7 (ref 5.0–8.0)

## 2020-09-21 LAB — CBC WITH DIFFERENTIAL/PLATELET
Abs Immature Granulocytes: 0.02 10*3/uL (ref 0.00–0.07)
Basophils Absolute: 0 10*3/uL (ref 0.0–0.1)
Basophils Relative: 0 %
Eosinophils Absolute: 0 10*3/uL (ref 0.0–0.5)
Eosinophils Relative: 0 %
HCT: 39.5 % (ref 36.0–46.0)
Hemoglobin: 13.2 g/dL (ref 12.0–15.0)
Immature Granulocytes: 0 %
Lymphocytes Relative: 16 %
Lymphs Abs: 1.2 10*3/uL (ref 0.7–4.0)
MCH: 31.4 pg (ref 26.0–34.0)
MCHC: 33.4 g/dL (ref 30.0–36.0)
MCV: 93.8 fL (ref 80.0–100.0)
Monocytes Absolute: 0.5 10*3/uL (ref 0.1–1.0)
Monocytes Relative: 6 %
Neutro Abs: 5.7 10*3/uL (ref 1.7–7.7)
Neutrophils Relative %: 78 %
Platelets: 218 10*3/uL (ref 150–400)
RBC: 4.21 MIL/uL (ref 3.87–5.11)
RDW: 12.8 % (ref 11.5–15.5)
WBC: 7.4 10*3/uL (ref 4.0–10.5)
nRBC: 0 % (ref 0.0–0.2)

## 2020-09-21 LAB — LIPASE, BLOOD: Lipase: 33 U/L (ref 11–51)

## 2020-09-21 MED ORDER — NITROFURANTOIN MONOHYD MACRO 100 MG PO CAPS: 100.0000 mg | ORAL_CAPSULE | Freq: Two times a day (BID) | ORAL | 0 refills | Status: AC

## 2020-09-21 MED ORDER — SENNOSIDES-DOCUSATE SODIUM 8.6-50 MG PO TABS: 1.0000 | ORAL_TABLET | Freq: Every day | ORAL | 0 refills | Status: DC

## 2020-09-21 MED ORDER — BISACODYL 10 MG RE SUPP: 10.0000 mg | Freq: Every day | RECTAL | 0 refills | Status: DC | PRN

## 2020-09-21 MED ORDER — IOHEXOL 300 MG/ML  SOLN
100.0000 mL | Freq: Once | INTRAMUSCULAR | Status: AC | PRN
  Administered 2020-09-21: 100 mL via INTRAVENOUS

## 2020-09-21 MED ORDER — NITROFURANTOIN MONOHYD MACRO 100 MG PO CAPS
100.0000 mg | ORAL_CAPSULE | Freq: Once | ORAL | Status: AC
  Administered 2020-09-21: 100 mg via ORAL
  Filled 2020-09-21: qty 1

## 2020-09-21 NOTE — ED Notes (Signed)
Pt ambulatory with standby assist to restroom. Pt denies any increase in dizziness

## 2020-09-21 NOTE — ED Notes (Signed)
Patient transported to X-ray 

## 2020-09-21 NOTE — ED Notes (Signed)
ED Provider at bedside. 

## 2020-09-21 NOTE — ED Provider Notes (Signed)
[facility] [place] [facility] EMERGENCY DEPARTMENT Provider Note   CSN: [id number] Arrival date & time: 09/21/20  6222     History Chief Complaint  Patient presents with  . Nausea    [name] is a 74 y.o. female with a history of resting tremor, osteoporosis, presenting to emergency department with complaint of feeling generally unwell and constipated for the past several days.  The patient reports that she felt she was having subjective fevers and chills about a week ago.  These had resolved but then returned again yesterday.  She reports that she also felt that she had been constipated for the past several days, which is unusual for her.  She had a very small bowel movement yesterday.  She denies any prior history of bowel obstructions, diverticulitis or colitis.  She reports some nausea but denies any vomiting.  She denies any dysuria or history of UTIs.  She is not having any abdominal pain specifically, but is most concerned about her constipation, which is unusual for her.  She has not taken anything at home for the constipation.  She denies any cough, congestion, headache, chest pain, chest pressure.  She reports that she had Covid approximately 1 year ago, subsequently is received both doses of a vaccine.  There are no sick contacts in the house.  She has not been exposed any children or toddlers.  She lives with her husband who is feeling well.  She denies any history of abdominal surgeries. She denies any hx of MI.  HPI     Past Medical History:  Diagnosis Date  . Allergy   . Arthritis    osteoarthritis  . Endometriosis   . Fibromyalgia   . NGB (new growth of bladder)    Noted at routine cystoscopy during prolapse surgery - April 2017.  Urology evaluationn benign and no biopsy needed.  . Osteoporosis   . Osteoporosis, unspecified 06/04/2014   PCP management with medication  . PONV (postoperative nausea and vomiting)   . Skin cancer   . Tremor    benign tremor  mainly in neck    Patient Active Problem List   Diagnosis Date Noted  . Status post surgery 02/17/2016  . Osteoporosis, unspecified 06/04/2014    Class: History of    Past Surgical History:  Procedure Laterality Date  . ANTERIOR AND POSTERIOR REPAIR N/A 02/17/2016   Procedure: ANTERIOR (CYSTOCELE) AND POSTERIOR REPAIR (RECTOCELE); Enterocele repair and lysis of adhesions;  Surgeon: Nunzio Cobbs, MD;  Location: [facility];  Service: Gynecology;  Laterality: N/A;  . APPENDECTOMY    . APPENDECTOMY    . BLADDER SUSPENSION N/A 02/17/2016   Procedure: TRANSVAGINAL TAPE (TVT) PROCEDURE exact midurethral sling;  Surgeon: Nunzio Cobbs, MD;  Location: [facility];  Service: Gynecology;  Laterality: N/A;  . CYSTO N/A 02/17/2016   Procedure: Kathrene Alu;  Surgeon: Nunzio Cobbs, MD;  Location: [facility];  Service: Gynecology;  Laterality: N/A;  . STAPEDES SURGERY     left ear  . TOTAL ABDOMINAL HYSTERECTOMY  1980's     OB History    Gravida  1   Para  1   Term  1   Preterm      AB      Living  1     SAB      TAB      Ectopic      Multiple      Live Births  Family History  Problem Relation Age of Onset  . Heart disease Father   . Stroke Father   . Hypertension Father   . Alzheimer's disease Mother   . Osteoporosis Mother   . Breast cancer Neg Hx     Social History   Tobacco Use  . Smoking status: Never Smoker  . Smokeless tobacco: Never Used  Substance Use Topics  . Alcohol use: No    Alcohol/week: 0.0 standard drinks  . Drug use: No    Home Medications Prior to Admission medications   Medication Sig Start Date End Date Taking? Authorizing Provider  bisacodyl (DULCOLAX) 10 MG suppository Place 1 suppository (10 mg total) rectally daily as needed for up to 5 doses for moderate constipation or severe constipation. 09/21/20   Wyvonnia Dusky, MD  Calcium Carb-Cholecalciferol 600-800 MG-UNIT TABS Take 0.5 tablets by mouth 2  (two) times daily.    [provider]  Cholecalciferol (VITAMIN D) 2000 units tablet Take 2,000 Units by mouth daily.    [provider]  CINNAMON PO Take 1 capsule by mouth as needed.     [provider]  Coenzyme Q10 (CO Q 10 PO) Take by mouth.    [provider]  gabapentin (NEURONTIN) 300 MG capsule Take 300 mg by mouth 3 (three) times daily.    [provider]  ibandronate (BONIVA) 150 MG tablet Take 150 mg by mouth every 30 (thirty) days. Take in the morning with a full glass of water, on an empty stomach, and do not take anything else by mouth or lie down for the next 30 min.    [provider]  irbesartan (AVAPRO) 75 MG tablet TK 1 T PO QD. REPLACES VALSARTAN 06/06/17   [provider]  Multiple Vitamin (MULTIVITAMIN) tablet Take 1 tablet by mouth daily.    [provider]  nitrofurantoin, macrocrystal-monohydrate, (MACROBID) 100 MG capsule Take 1 capsule (100 mg total) by mouth 2 (two) times daily for 5 days. 09/21/20 09/26/20  Wyvonnia Dusky, MD  Omega-3 Fatty Acids (FISH OIL) 500 MG CAPS Take 1 capsule by mouth daily.     [provider]  pravastatin (PRAVACHOL) 40 MG tablet TK 1 T PO QD 08/09/18   [provider]  senna-docusate (SENOKOT-S) 8.6-50 MG tablet Take 1 tablet by mouth daily. 09/21/20   Wyvonnia Dusky, MD  triamcinolone (NASACORT AQ) 55 MCG/ACT AERO nasal inhaler Place 2 sprays into the nose daily.    [provider]    Allergies    Amoxicillin and Eggs or egg-derived products  Review of Systems   Review of Systems  Constitutional: Positive for fever. Negative for chills.  HENT: Negative for ear pain and sore throat.   Eyes: Negative for pain and visual disturbance.  Respiratory: Negative for cough and shortness of breath.   Cardiovascular: Negative for chest pain and palpitations.  Gastrointestinal: Positive for constipation and nausea. Negative for abdominal pain,  diarrhea and vomiting.  Genitourinary: Negative for dysuria and hematuria.  Musculoskeletal: Negative for arthralgias and myalgias.  Skin: Negative for color change and rash.  Neurological: Negative for tremors and headaches.  Psychiatric/Behavioral: Negative for agitation and confusion.  All other systems reviewed and are negative.   Physical Exam Updated Vital Signs BP (!) 149/76 (BP Location: Right Arm)   Pulse 66   Temp 98.5 F (36.9 C) (Oral)   Resp 20   Ht 5\' 4"  (1.626 m)   Wt 59 kg   SpO2 98%  BMI 22.31 kg/m   Physical Exam Vitals and nursing note reviewed.  Constitutional:      General: She is not in acute distress.    Appearance: She is well-developed.     Comments: Resting tremor of head and hands (baseline per patient and husband)  HENT:     Head: Normocephalic and atraumatic.  Eyes:     Conjunctiva/sclera: Conjunctivae normal.     Pupils: Pupils are equal, round, and reactive to light.  Cardiovascular:     Rate and Rhythm: Normal rate and regular rhythm.     Pulses: Normal pulses.  Pulmonary:     Effort: Pulmonary effort is normal. No respiratory distress.     Comments: Diminished breath sounds in left lower lobe Abdominal:     General: There is no distension.     Palpations: Abdomen is soft. There is no mass.     Tenderness: There is no abdominal tenderness.     Hernia: No hernia is present.  Musculoskeletal:     Cervical back: Neck supple.  Skin:    General: Skin is warm and dry.  Neurological:     General: No focal deficit present.     Mental Status: She is alert and oriented to person, place, and time.  Psychiatric:        Mood and Affect: Mood normal.        Behavior: Behavior normal.     ED Results / Procedures / Treatments   Labs (all labs ordered are listed, but only abnormal results are displayed) Labs Reviewed  COMPREHENSIVE METABOLIC PANEL - Abnormal; Notable for the following components:      Result Value   Glucose, Bld 111 (*)      BUN 7 (*)    All other components within normal limits  URINALYSIS, ROUTINE W REFLEX MICROSCOPIC - Abnormal; Notable for the following components:   APPearance CLOUDY (*)    Hgb urine dipstick TRACE (*)    Nitrite POSITIVE (*)    Leukocytes,Ua SMALL (*)    All other components within normal limits  URINALYSIS, MICROSCOPIC (REFLEX) - Abnormal; Notable for the following components:   Bacteria, UA MANY (*)    All other components within normal limits  URINE CULTURE  CBC WITH DIFFERENTIAL/PLATELET  LIPASE, BLOOD    EKG None  Radiology DG Chest 2 View  Result Date: 09/21/2020 CLINICAL DATA:  Nausea and constipation for 1 week. EXAM: CHEST - 2 VIEW COMPARISON:  09/19/2017 FINDINGS: Lungs are hyperexpanded. The lungs are clear without focal pneumonia, edema, pneumothorax or pleural effusion. The cardiopericardial silhouette is within normal limits for size. Nodular density at the right base is compatible with nipple shadow. The visualized bony structures of the thorax show no acute abnormality. IMPRESSION: Hyperexpansion without acute cardiopulmonary findings. Electronically Signed   By: Misty Stanley M.D.   On: 09/21/2020 09:27   CT ABDOMEN PELVIS W CONTRAST  Result Date: 09/21/2020 CLINICAL DATA:  Lower abdominal pain with intermittent constipation and nausea EXAM: CT ABDOMEN AND PELVIS WITH CONTRAST TECHNIQUE: Multidetector CT imaging of the abdomen and pelvis was performed using the standard protocol following bolus administration of intravenous contrast. CONTRAST:  138mL OMNIPAQUE IOHEXOL 300 MG/ML  SOLN COMPARISON:  None. FINDINGS: Lower chest: There are scattered areas of scarring in the lung bases. No lung base edema or airspace opacity evident. There is a small hiatal hernia. Hepatobiliary: There is a degree of underlying hepatic steatosis. There are occasional subcentimeter cysts scattered throughout the liver. Beyond tiny apparent  cysts, no focal liver lesions are evident.  Gallbladder wall is not appreciably thickened. There is no biliary duct dilatation. Pancreas: There is no pancreatic mass or inflammatory focus. Spleen: No splenic lesions are evident. Adrenals/Urinary Tract: Adrenals bilaterally appear normal. There is a cyst in the upper pole of the left kidney measuring 3.0 x 2.8 cm. There is an angiomyolipoma in the lateral mid left kidney measuring 1.2 x 1.0 cm. Tiny cysts elsewhere noted in the left kidney. There is a cyst arising from the lateral aspect of the lower pole left kidney measuring 0.7 x 0.6 cm. There is no evident hydronephrosis on either side. There is no evident renal or ureteral calculus on either side. Urinary bladder is midline with slight thickening of the urinary bladder wall. Stomach/Bowel: There is moderate stool throughout the colon. Note that there is mild distension of the rectum with stool. There is no appreciable diverticular disease. There is no appreciable bowel wall or mesenteric thickening. Terminal ileum appears normal. No evident bowel obstruction. No free air or portal venous air. Vascular/Lymphatic: There is no abdominal aortic aneurysm. There are foci of aortic and iliac artery atherosclerosis. Major venous structures appear patent. No adenopathy is evident in the abdomen or pelvis. Reproductive: Uterus is absent.  No adnexal region masses. Other: Appendix reportedly absent. No periappendiceal region inflammation. No evident abscess or ascites in the abdomen or pelvis. There is slight fat in the umbilicus. Musculoskeletal: There is degenerative change in the lumbar spine. There is no blastic or lytic bone lesion. There is no appreciable intramuscular lesion. IMPRESSION: 1. Rectum mildly distended with stool. Moderate stool overall throughout the colon. No diverticular disease. No bowel wall obstruction evident. No evident abscess in the abdomen pelvis. Appendix reportedly absent. No right lower quadrant inflammation evident. 2. No renal or  ureteral calculi. No hydronephrosis. Urinary bladder wall appears slightly thickened. Advise correlation with urinalysis to assess for possible degree of cystitis. 3.  Hepatic steatosis. 4.  Small hiatal hernia. 5. 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 Several renal cysts on the left as well. 6.  Aortic Atherosclerosis (ICD10-I70.0). 7.  Uterus absent. Electronically Signed   By: Lowella Grip III M.D.   On: 09/21/2020 10:44    Procedures Procedures (including critical care time)  Medications Ordered in ED Medications  nitrofurantoin (macrocrystal-monohydrate) (MACROBID) capsule 100 mg (100 mg Oral Given 09/21/20 0945)  iohexol (OMNIPAQUE) 300 MG/ML solution 100 mL (100 mLs Intravenous Contrast Given 09/21/20 0955)    ED Course  I have reviewed the triage vital signs and the nursing notes.  Pertinent labs & imaging results that were available during my care of the patient were reviewed by me and considered in my medical decision making (see chart for details).  This patient complains of constipation, fevers and chills for 1 week.  This involves an extensive number of treatment options, and is a complaint that carries with it a high risk of complications and morbidity.  The differential diagnosis includes constipation versus colitis versus diverticulitis versus cystitis versus appendicitis versus other.  There are diminished breath sounds in the left lower lobe and her subjective fevers and chills at home, we will also obtain an x-ray to look for pneumonia.  I have a low suspicion for sepsis based on her clinical presentation.  Also low suspicion for PE or atypical ACS given his presentation.  It appears that her primary symptoms are GI related.  She has no active pain on exam.  She has not needing any  pain medications at this time.  I ordered, reviewed, and interpreted labs, which included UA suggestive of cystitis (+nitrites, many bacteria), WBC 7.4, CMP largely  unremarkable I ordered medication macrobid for UTI I ordered imaging studies which included Ct abdomen pelvis with IV contrast and xray chest I independently visualized and interpreted imaging which showed moderate stool in colon, thickened bladder, and other incidental findings.  No evident PNA on xray of the chest Additional history was obtained from patient's husband at bedside   Clinical Course as of Sep 22 1727  Nancy Fetter Sep 21, 2020  0940 Appears to have cystitis, with her allergies will treat with macrobid.  However with her new onset of constipation symptoms I think a CT scan is still reasonable to evaluate for obstruction or diverticulitis.     [MT]  1103 I discussed the diagnosis with the patient.  We will treat her for UTI as well as for constipation.  I recommended p.o. and rectal laxative and suppository, and she verbalized understanding.   [MT]    Clinical Course User Index [MT] Diamante Rubin, Carola Rhine, MD    Final Clinical Impression(s) / ED Diagnoses Final diagnoses:  Cystitis  Constipation, unspecified constipation type    Rx / DC Orders ED Discharge Orders         Ordered    senna-docusate (SENOKOT-S) 8.6-50 MG tablet  Daily        09/21/20 1107    bisacodyl (DULCOLAX) 10 MG suppository  Daily PRN        09/21/20 1107    nitrofurantoin, macrocrystal-monohydrate, (MACROBID) 100 MG capsule  2 times daily        09/21/20 1107           Wyvonnia Dusky, MD 09/21/20 1728

## 2020-09-21 NOTE — ED Triage Notes (Signed)
States that she hasn't felt well since last Sunday. C/o nausea and constipation since last Sunday.

## 2020-09-21 NOTE — Discharge Instructions (Signed)
For your urine infection, you will need to take 5 days of an antibiotic called Macrobid.  Your next dose is due tonight before bedtime.  Please complete the full course of antibiotics.  For your constipation, I prescribed you a rectal suppository called Dulcolax.  You can use this up to 5 days in a row.  You need to have a good bowel cleanout.  When you get home today, you can take a dose of Colace (mouth pill) along with the suppository at the same time.  You can also buy and drink some prune juice.  Drink lots of water today.  This should cause a bowel movement by the end of the day.  If this does not work, you can go back to the pharmacy and buy magnesium citrate over the counter.  This is a very strong laxative that should cause a bowel movement in 1-3 hours.  Please follow-up with your primary care doctor this week for both of your issues.

## 2020-09-22 ENCOUNTER — Ambulatory Visit: Payer: Medicare Other

## 2020-09-23 LAB — URINE CULTURE: Culture: >=100000 — AB

## 2020-09-24 ENCOUNTER — Telehealth: Payer: Self-pay | Admitting: *Deleted

## 2020-09-24 NOTE — Telephone Encounter (Signed)
Post ED Visit - Positive Culture Follow-up  Culture report reviewed by antimicrobial stewardship pharmacist: [facility] Team []  Elenor Quinones, Pharm.D. []  Heide Guile, Pharm.D., BCPS AQ-ID []  Parks Neptune, Pharm.D., BCPS []  Alycia Rossetti, Pharm.D., BCPS []  Dorothy, Pharm.D., BCPS, AAHIVP []  Legrand Como, Pharm.D., BCPS, AAHIVP []  Salome Arnt, PharmD, BCPS []  Johnnette Gourd, PharmD, BCPS []  Hughes Better, PharmD, BCPS []  Leeroy Cha, PharmD []  Laqueta Linden, PharmD, BCPS [x]  Albertina Parr, PharmD  [facility] Team []  Leodis Sias, PharmD []  Lindell Spar, PharmD []  Royetta Asal, PharmD []  Graylin Shiver, Rph []  Rema Fendt) Glennon Mac, PharmD []  Arlyn Dunning, PharmD []  Netta Cedars, PharmD []  Dia Sitter, PharmD []  Leone Haven, PharmD []  Gretta Arab, PharmD []  Theodis Shove, PharmD []  Peggyann Juba, PharmD []  Reuel Boom, PharmD   Positive urine culture Treated with Nitrofurantoin Monohyd Macro, organism sensitive to the same and no further patient follow-up is required at this time.  Harlon Flor [facility] 09/24/2020, 12:17 PM

## 2020-10-08 ENCOUNTER — Ambulatory Visit
Admission: RE | Admit: 2020-10-08 | Discharge: 2020-10-08 | Disposition: A | Discharge status: Home / Self Care | Payer: Medicare Other | Source: Ambulatory Visit | Attending: Internal Medicine | Admitting: Internal Medicine

## 2020-10-08 ENCOUNTER — Ambulatory Visit: Payer: Medicare Other

## 2020-10-08 ENCOUNTER — Other Ambulatory Visit: Payer: Self-pay

## 2020-10-08 DIAGNOSIS — Z1231 Encounter for screening mammogram for malignant neoplasm of breast: Secondary | ICD-10-CM

## 2020-11-03 ENCOUNTER — Ambulatory Visit: Payer: Medicare Other | Admitting: Certified Nurse Midwife

## 2020-11-13 NOTE — Progress Notes (Signed)
75 y.o. G22P1001 Married White or Caucasian female here for breast & pelvic exam.      HxTAH, A+P repair, TVT, reports no problems with bladder  No LMP recorded. Patient has had a hysterectomy.          Sexually active: Yes.    The current method of family planning is status post hysterectomy.    Exercising: Yes.    walking Smoker:  no  Health Maintenance: Pap:  05-08-09 neg History of abnormal Pap:  no MMG:  10-08-2020 category c density birads 1:neg Colonoscopy:  2014 neg BMD:   2021 mobile unit TDaP:  2015 Gardasil:   n/a Covid-19: done Hep C testing: not sure Screening Labs: with pcp   reports that she has never smoked. She has never used smokeless tobacco. She reports that she does not drink alcohol and does not use drugs.  Past Medical History:  Diagnosis Date  . Allergy   . Arthritis    osteoarthritis  . Endometriosis   . Fibromyalgia   . NGB (new growth of bladder)    Noted at routine cystoscopy during prolapse surgery - April 2017.  Urology evaluationn benign and no biopsy needed.  . Osteoporosis   . Osteoporosis, unspecified 06/04/2014   PCP management with medication  . PONV (postoperative nausea and vomiting)   . Skin cancer   . 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 ANTERIOR AND POSTERIOR REPAIR N/A 02/17/2016   Procedure: ANTERIOR (CYSTOCELE) AND POSTERIOR REPAIR (RECTOCELE); Enterocele repair and lysis of adhesions;  Surgeon: Nunzio Cobbs, MD;  Location: [facility];  Service: Gynecology;  Laterality: N/A;  . APPENDECTOMY    . APPENDECTOMY    . BLADDER SUSPENSION N/A 02/17/2016   Procedure: TRANSVAGINAL TAPE (TVT) PROCEDURE exact midurethral sling;  Surgeon: Nunzio Cobbs, MD;  Location: [facility];  Service: Gynecology;  Laterality: N/A;  . CYSTO N/A 02/17/2016   Procedure: Kathrene Alu;  Surgeon: Nunzio Cobbs, MD;  Location: [facility];  Service: Gynecology;  Laterality: N/A;  .  STAPEDES SURGERY     left ear  . TOTAL ABDOMINAL HYSTERECTOMY  1980's    Current Outpatient Medications  Medication Sig Dispense Refill  . Calcium Carb-Cholecalciferol 600-800 MG-UNIT TABS Take 0.5 tablets by mouth 2 (two) times daily.    . Cholecalciferol (VITAMIN D) 2000 units tablet Take 2,000 Units by mouth daily.    Marland Kitchen CINNAMON PO Take 1 capsule by mouth as needed.     . Coenzyme Q10 (CO Q 10 PO) Take by mouth.    . gabapentin (NEURONTIN) 300 MG capsule Take 300 mg by mouth 3 (three) times daily.    Marland Kitchen galantamine (RAZADYNE ER) 16 MG 24 hr capsule Take 16 mg by mouth every morning.    . irbesartan (AVAPRO) 75 MG tablet TK 1 T PO QD. REPLACES VALSARTAN  11  . Multiple Vitamin (MULTIVITAMIN) tablet Take 1 tablet by mouth daily.    . Omega-3 Fatty Acids (FISH OIL) 500 MG CAPS Take 1 capsule by mouth daily.     . pravastatin (PRAVACHOL) 40 MG tablet TK 1 T PO QD  6  . triamcinolone (NASACORT) 55 MCG/ACT AERO nasal inhaler Place 2 sprays into the nose daily.     No current facility-administered medications for this visit.    Family History  Problem Relation Age of Onset  . Heart disease Father   .  Stroke Father   . Hypertension Father   . Alzheimer's disease Mother   . Osteoporosis Mother   . Breast cancer Neg Hx     Review of Systems  Constitutional: Negative.   HENT: Negative.   Eyes: Negative.   Respiratory: Negative.   Cardiovascular: Negative.   Gastrointestinal: Negative.   Endocrine: Negative.   Genitourinary: Negative.   Musculoskeletal: Negative.   Skin: Negative.   Allergic/Immunologic: Negative.   Neurological: Negative.   Hematological: Negative.   Psychiatric/Behavioral: Negative.     Exam:   BP 116/70   Pulse 68   Resp 16   Ht 5' 3.25" (1.607 m)   Wt 131 lb (59.4 kg)   BMI 23.02 kg/m   Height: 5' 3.25" (160.7 cm)  General appearance: alert, cooperative and appears stated age, no acute distress Head: Normocephalic, without obvious  abnormality Neck: no adenopathy, thyroid normal to inspection and palpation Lungs: clear to auscultation bilaterally Breasts: No axillary or supraclavicular adenopathy, Normal to palpation without dominant masses Heart: regular rate and rhythm Abdomen: soft, non-tender; no masses,  no organomegaly Extremities: extremities normal, no edema Skin: No rashes or lesions Lymph nodes: Cervical, supraclavicular, and axillary nodes normal. No abnormal inguinal nodes palpated Neurologic: Grossly normal   Pelvic: External genitalia:  no lesions              Urethra:  normal appearing urethra with no masses, tenderness or lesions              Bartholins and Skenes: normal                 Vagina: normal appearing vagina with atrophy, appropriate for age, normal appearing discharge, no lesions              Bimanual Exam:   Uterus:  uterus absent              Adnexa: no mass, fullness, tenderness   Rectal exam: no mass, soft hemorrhoid noted                 Ladona Ridgel, CMA Chaperone was present for exam.  A:  Well Woman with normal exam, breast and pelvic screening  Osteoporosis  P:   Pap : not indicated, S/P hyst  Mammogram: UTD done 10/2020  Labs: with PCP  Medications: no HRT, no vaginal estrogen use  Advised to discuss Osteoporosis with Dr. Waynard Edwards about treatment. Advised if she would like to discuss treatment with MD in our office, an appointment can be scheduled. Discussed fall prevention.  F/u 1-2 years

## 2020-11-17 ENCOUNTER — Encounter: Payer: Self-pay | Admitting: Nurse Practitioner

## 2020-11-17 ENCOUNTER — Ambulatory Visit: Payer: Medicare Other | Admitting: Obstetrics & Gynecology

## 2020-11-17 ENCOUNTER — Ambulatory Visit (INDEPENDENT_AMBULATORY_CARE_PROVIDER_SITE_OTHER): Payer: Medicare Other | Admitting: Nurse Practitioner

## 2020-11-17 ENCOUNTER — Other Ambulatory Visit: Payer: Self-pay

## 2020-11-17 VITALS — BP 116/70 | HR 68 | Resp 16 | Ht 63.25 in | Wt 131.0 lb

## 2020-11-17 DIAGNOSIS — Z01419 Encounter for gynecological examination (general) (routine) without abnormal findings: Secondary | ICD-10-CM | POA: Diagnosis not present

## 2020-11-17 NOTE — Patient Instructions (Signed)
Osteoporosis  Osteoporosis is when the bones get thin and weak. This can cause your bones to break (fracture) more easily. What are the causes? The exact cause of this condition is not known. What increases the risk?  Having family members with this condition.  Not eating enough healthy foods.  Taking certain medicines.  Being female.  Being age 76 or older.  Smoking or using other products that contain nicotine or tobacco, such as e-cigarettes or chewing tobacco.  Not exercising.  Being of European or Asian ancestry.  Having a small body frame. What are the signs or symptoms? A broken bone might be the first sign, especially if the break results from a fall or injury that usually would not cause a bone to break. Other signs and symptoms include:  Pain in the neck or low back.  Being hunched over (stooped posture).  Getting shorter. How is this treated?  Eating more foods with more calcium and vitamin D in them.  Doing exercises.  Stopping tobacco use.  Limiting how much alcohol you drink.  Taking medicines to slow bone loss or help make the bones stronger.  Taking supplements of calcium and vitamin D every day.  Taking medicines to replace chemicals in the body (hormone replacement medicines).  Monitoring your levels of calcium and vitamin D. The goal of treatment is to strengthen your bones and lower your risk for a bone break. Follow these instructions at home: Eating and drinking Eat plenty of calcium and vitamin D. These nutrients are good for your bones. Good sources of calcium and vitamin D include:  Some fish, such as salmon and tuna.  Foods that have calcium and vitamin D added to them (fortified foods), such as some breakfast cereals.  Egg yolks.  Cheese.  Liver.   Activity Do exercises as told by your doctor. Ask your doctor what exercises are safe for you. You should do:  Exercises that make your muscles work to hold your body weight up  (weight-bearing exercises). These include tai chi, yoga, and walking.  Exercises to make your muscles stronger. One example is lifting weights. Lifestyle  Do not drink alcohol if: ? Your doctor tells you not to drink. ? You are pregnant, may be pregnant, or are planning to become pregnant.  If you drink alcohol: ? Limit how much you use to:  0-1 drink a day for women.  0-2 drinks a day for men.  Know how much alcohol is in your drink. In the [place] one drink equals one 12 oz bottle of beer (355 mL), one 5 oz glass of wine (148 mL), or one 1 oz glass of hard liquor (44 mL).  Do not smoke or use any products that contain nicotine or tobacco. If you need help quitting, ask your doctor. Preventing falls  Use tools to help you move around (mobility aids) as needed. These include canes, walkers, scooters, and crutches.  Keep rooms well-lit.  Put away things on the floor that could make you trip. These include cords and rugs.  Install safety rails on stairs. Install grab bars in bathrooms.  Use rubber mats in slippery areas, like bathrooms.  Wear shoes that: ? Fit you well. ? Support your feet. ? Have closed toes. ? Have rubber soles or low heels.  Tell your doctor about all of the medicines you are taking. Some medicines can make you more likely to fall. General instructions  Take over-the-counter and prescription medicines only as told by your doctor.    Keep all follow-up visits. Contact a doctor if:  You have not been tested (screened) for osteoporosis and you are: ? A woman who is age 67 or older. ? A man who is age 13 or older. Get help right away if:  You fall.  You get hurt. Summary  Osteoporosis happens when your bones get thin and weak.  Weak bones can break (fracture) more easily.  Eat plenty of calcium and vitamin D. These are good for your bones.  Tell your doctor about all of the medicines that you take. This information is not intended to replace  advice given to you by your health care provider. Make sure you discuss any questions you have with your health care provider. Document Revised: 04/10/2020 Document Reviewed: 04/10/2020 Elsevier Patient Education  2021 Elsevier Inc[employer]   Health Maintenance After Age 1 After age 46, you are at a higher risk for certain long-term diseases and infections as well as injuries from falls. Falls are a major cause of broken bones and head injuries in people who are older than age 58. Getting regular preventive care can help to keep you healthy and well. Preventive care includes getting regular testing and making lifestyle changes as recommended by your health care provider. Talk with your health care provider about:  Which screenings and tests you should have. A screening is a test that checks for a disease when you have no symptoms.  A diet and exercise plan that is right for you. What should I know about screenings and tests to prevent falls? Screening and testing are the best ways to find a health problem early. Early diagnosis and treatment give you the best chance of managing medical conditions that are common after age 106. Certain conditions and lifestyle choices may make you more likely to have a fall. Your health care provider may recommend:  Regular vision checks. Poor vision and conditions such as cataracts can make you more likely to have a fall. If you wear glasses, make sure to get your prescription updated if your vision changes.  Medicine review. Work with your health care provider to regularly review all of the medicines you are taking, including over-the-counter medicines. Ask your health care provider about any side effects that may make you more likely to have a fall. Tell your health care provider if any medicines that you take make you feel dizzy or sleepy.  Osteoporosis screening. Osteoporosis is a condition that causes the bones to get weaker. This can make the bones weak and cause  them to break more easily.  Blood pressure screening. Blood pressure changes and medicines to control blood pressure can make you feel dizzy.  Strength and balance checks. Your health care provider may recommend certain tests to check your strength and balance while standing, walking, or changing positions.  Foot health exam. Foot pain and numbness, as well as not wearing proper footwear, can make you more likely to have a fall.  Depression screening. You may be more likely to have a fall if you have a fear of falling, feel emotionally low, or feel unable to do activities that you used to do.  Alcohol use screening. Using too much alcohol can affect your balance and may make you more likely to have a fall. What actions can I take to lower my risk of falls? General instructions  Talk with your health care provider about your risks for falling. Tell your health care provider if: ? You fall. Be sure to tell your  health care provider about all falls, even ones that seem minor. ? You feel dizzy, sleepy, or off-balance.  Take over-the-counter and prescription medicines only as told by your health care provider. These include any supplements.  Eat a healthy diet and maintain a healthy weight. A healthy diet includes low-fat dairy products, low-fat (lean) meats, and fiber from whole grains, beans, and lots of fruits and vegetables. Home safety  Remove any tripping hazards, such as rugs, cords, and clutter.  Install safety equipment such as grab bars in bathrooms and safety rails on stairs.  Keep rooms and walkways well-lit. Activity  Follow a regular exercise program to stay fit. This will help you maintain your balance. Ask your health care provider what types of exercise are appropriate for you.  If you need a cane or walker, use it as recommended by your health care provider.  Wear supportive shoes that have nonskid soles.   Lifestyle  Do not drink alcohol if your health care provider  tells you not to drink.  If you drink alcohol, limit how much you have: ? 0-1 drink a day for women. ? 0-2 drinks a day for men.  Be aware of how much alcohol is in your drink. In the [place] one drink equals one typical bottle of beer (12 oz), one-half glass of wine (5 oz), or one shot of hard liquor (1 oz).  Do not use any products that contain nicotine or tobacco, such as cigarettes and e-cigarettes. If you need help quitting, ask your health care provider. Summary  Having a healthy lifestyle and getting preventive care can help to protect your health and wellness after age 43.  Screening and testing are the best way to find a health problem early and help you avoid having a fall. Early diagnosis and treatment give you the best chance for managing medical conditions that are more common for people who are older than age 73.  Falls are a major cause of broken bones and head injuries in people who are older than age 10. Take precautions to prevent a fall at home.  Work with your health care provider to learn what changes you can make to improve your health and wellness and to prevent falls. This information is not intended to replace advice given to you by your health care provider. Make sure you discuss any questions you have with your health care provider. Document Revised: 02/15/2019 Document Reviewed: 09/07/2017 Elsevier Patient Education  2021 [employer].

## 2021-01-10 ENCOUNTER — Emergency Department (HOSPITAL_BASED_OUTPATIENT_CLINIC_OR_DEPARTMENT_OTHER)
Admission: EM | Admit: 2021-01-10 | Discharge: 2021-01-11 | Disposition: A | Discharge status: Home / Self Care | Payer: Medicare Other | Attending: Emergency Medicine | Admitting: Emergency Medicine

## 2021-01-10 ENCOUNTER — Other Ambulatory Visit: Payer: Self-pay

## 2021-01-10 ENCOUNTER — Encounter (HOSPITAL_BASED_OUTPATIENT_CLINIC_OR_DEPARTMENT_OTHER): Payer: Self-pay | Admitting: *Deleted

## 2021-01-10 ENCOUNTER — Emergency Department (HOSPITAL_BASED_OUTPATIENT_CLINIC_OR_DEPARTMENT_OTHER): Payer: Medicare Other

## 2021-01-10 DIAGNOSIS — R0781 Pleurodynia: Secondary | ICD-10-CM | POA: Diagnosis present

## 2021-01-10 DIAGNOSIS — J90 Pleural effusion, not elsewhere classified: Secondary | ICD-10-CM | POA: Diagnosis not present

## 2021-01-10 DIAGNOSIS — Z85828 Personal history of other malignant neoplasm of skin: Secondary | ICD-10-CM | POA: Diagnosis not present

## 2021-01-10 DIAGNOSIS — I313 Pericardial effusion (noninflammatory): Secondary | ICD-10-CM | POA: Insufficient documentation

## 2021-01-10 DIAGNOSIS — Z79899 Other long term (current) drug therapy: Secondary | ICD-10-CM | POA: Insufficient documentation

## 2021-01-10 DIAGNOSIS — I3139 Other pericardial effusion (noninflammatory): Secondary | ICD-10-CM

## 2021-01-10 LAB — CBC
HCT: 39.8 % (ref 36.0–46.0)
Hemoglobin: 13.3 g/dL (ref 12.0–15.0)
MCH: 32.4 pg (ref 26.0–34.0)
MCHC: 33.4 g/dL (ref 30.0–36.0)
MCV: 97.1 fL (ref 80.0–100.0)
Platelets: 245 10*3/uL (ref 150–400)
RBC: 4.1 MIL/uL (ref 3.87–5.11)
RDW: 12.8 % (ref 11.5–15.5)
WBC: 13.4 10*3/uL — ABNORMAL HIGH (ref 4.0–10.5)
nRBC: 0 % (ref 0.0–0.2)

## 2021-01-10 LAB — BASIC METABOLIC PANEL
Anion gap: 10 (ref 5–15)
BUN: 16 mg/dL (ref 8–23)
CO2: 26 mmol/L (ref 22–32)
Calcium: 9.4 mg/dL (ref 8.9–10.3)
Chloride: 102 mmol/L (ref 98–111)
Creatinine, Ser: 0.63 mg/dL (ref 0.44–1.00)
GFR, Estimated: >60 mL/min (ref >60)
Glucose, Bld: 103 mg/dL — ABNORMAL HIGH (ref 70–99)
Potassium: 3.7 mmol/L (ref 3.5–5.1)
Sodium: 138 mmol/L (ref 135–145)

## 2021-01-10 LAB — TROPONIN I (HIGH SENSITIVITY): Troponin I (High Sensitivity): 3 ng/L (ref <18)

## 2021-01-10 MED ORDER — IOHEXOL 350 MG/ML SOLN
100.0000 mL | Freq: Once | INTRAVENOUS | Status: AC | PRN
  Administered 2021-01-10: 100 mL via INTRAVENOUS

## 2021-01-10 NOTE — ED Notes (Signed)
Patient transported to Radiology at this time.

## 2021-01-10 NOTE — ED Provider Notes (Signed)
[facility] Provider Note: Natalie Spurling, MD, FACEP  CSN: [id number] MRN: [id number] ARRIVAL: 01/10/21 at 2131 ROOM: [place]  Chest Pain   HISTORY OF PRESENT ILLNESS  01/10/21 11:12 PM [name] is a 75 y.o. female with about a week of left lower chest pain.  The chest pain is pleuritic, worse with deep breathing.  It is not worse with palpation of her chest.  She is not short of breath but it does hurt to take a breath.  She describes the pain as sharp and rates it as an 8 out of 10.  She denies any fever, cough, or recent Covid symptoms.  She has been vaccinated for Covid.   Past Medical History:  Diagnosis Date  . Allergy   . Arthritis    osteoarthritis  . Endometriosis   . Fibromyalgia   . NGB (new growth of bladder)    Noted at routine cystoscopy during prolapse surgery - April 2017.  Urology evaluationn benign and no biopsy needed.  . Osteoporosis   . Osteoporosis, unspecified 06/04/2014   PCP management with medication  . PONV (postoperative nausea and vomiting)   . Skin cancer   . 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 ANTERIOR AND POSTERIOR REPAIR N/A 02/17/2016   Procedure: ANTERIOR (CYSTOCELE) AND POSTERIOR REPAIR (RECTOCELE); Enterocele repair and lysis of adhesions;  Surgeon: Nunzio Cobbs, MD;  Location: [facility];  Service: Gynecology;  Laterality: N/A;  . APPENDECTOMY    . APPENDECTOMY    . BLADDER SUSPENSION N/A 02/17/2016   Procedure: TRANSVAGINAL TAPE (TVT) PROCEDURE exact midurethral sling;  Surgeon: Nunzio Cobbs, MD;  Location: [facility];  Service: Gynecology;  Laterality: N/A;  . CYSTO N/A 02/17/2016   Procedure: Kathrene Alu;  Surgeon: Nunzio Cobbs, MD;  Location: [facility];  Service: Gynecology;  Laterality: N/A;  . STAPEDES SURGERY     left ear  . TOTAL ABDOMINAL HYSTERECTOMY  1980's    Family History  Problem Relation Age of Onset   . Heart disease Father   . Stroke Father   . Hypertension Father   . Alzheimer's disease Mother   . Osteoporosis Mother   . Breast cancer Neg Hx     Social History   Tobacco Use  . Smoking status: Never Smoker  . Smokeless tobacco: Never Used  Substance Use Topics  . Alcohol use: No    Alcohol/week: 0.0 standard drinks  . Drug use: No    Prior to Admission medications   Medication Sig Start Date End Date Taking? Authorizing Provider  naproxen (NAPROSYN) 375 MG tablet Take 1 tablet twice daily for pleuritic chest pain. 01/11/21  Yes Cillian Gwinner, MD  Calcium Carb-Cholecalciferol 600-800 MG-UNIT TABS Take 0.5 tablets by mouth 2 (two) times daily.    [provider]  Cholecalciferol (VITAMIN D) 2000 units tablet Take 2,000 Units by mouth daily.    [provider]  CINNAMON PO Take 1 capsule by mouth as needed.     [provider]  Coenzyme Q10 (CO Q 10 PO) Take by mouth.    [provider]  gabapentin (NEURONTIN) 300 MG capsule Take 300 mg by mouth 3 (three) times daily.    [provider]  galantamine (RAZADYNE ER) 16 MG 24 hr capsule Take 16 mg by mouth every morning. 11/06/20   [provider]  irbesartan (AVAPRO) 75 MG tablet TK 1 T PO QD. REPLACES VALSARTAN 06/06/17   [provider]  Multiple Vitamin (MULTIVITAMIN) tablet Take 1 tablet by mouth daily.    [provider]  Omega-3 Fatty Acids (FISH OIL) 500 MG CAPS Take 1 capsule by mouth daily.     [provider]  pravastatin (PRAVACHOL) 40 MG tablet TK 1 T PO QD 08/09/18   [provider]  triamcinolone (NASACORT) 55 MCG/ACT AERO nasal inhaler Place 2 sprays into the nose daily.    [provider]    Allergies Amoxicillin and Eggs or egg-derived products   REVIEW OF SYSTEMS  Negative except as noted here or in the History of Present Illness.   PHYSICAL EXAMINATION  Initial Vital Signs Blood pressure 130/65, pulse 63,  temperature 98.1 F (36.7 C), temperature source Oral, resp. rate 16, height 5\' 6"  (1.676 m), weight 59 kg, SpO2 100 %.  Examination General: Well-developed, well-nourished female in no acute distress; appearance consistent with age of record HENT: normocephalic; atraumatic Eyes: pupils equal, round and reactive to light; extraocular muscles intact Neck: supple Heart: regular rate and rhythm Lungs: clear to auscultation bilaterally Chest: Nontender Abdomen: soft; nondistended; nontender; bowel sounds present Extremities: No deformity; full range of motion; pulses normal Neurologic: Awake, alert and oriented; motor function intact in all extremities and symmetric; no facial droop; tremor Skin: Warm and dry Psychiatric: Normal mood and affect   RESULTS  Summary of this visit's results, reviewed and interpreted by myself:   EKG Interpretation  Date/Time:  Saturday January 10 2021 21:50:24 EST Ventricular Rate:  61 PR Interval:  172 QRS Duration: 80 QT Interval:  410 QTC Calculation: 412 R Axis:   68 Text Interpretation: Normal sinus rhythm Normal ECG Rate is slower Confirmed by Roth Ress [phone]) on 01/10/2021 10:38:05 PM      Laboratory Studies: Results for orders placed or performed during the hospital encounter of 01/10/21 (from the past 24 hour(s))  Basic metabolic panel     Status: Abnormal   Collection Time: 01/10/21 10:19 PM  Result Value Ref Range   Sodium 138 135 - 145 mmol/L   Potassium 3.7 3.5 - 5.1 mmol/L   Chloride 102 98 - 111 mmol/L   CO2 26 22 - 32 mmol/L   Glucose, Bld 103 (H) 70 - 99 mg/dL   BUN 16 8 - 23 mg/dL   Creatinine, Ser 0.63 0.44 - 1.00 mg/dL   Calcium 9.4 8.9 - 10.3 mg/dL   GFR, Estimated >60 >60 mL/min   Anion gap 10 5 - 15  CBC     Status: Abnormal   Collection Time: 01/10/21 10:19 PM  Result Value Ref Range   WBC 13.4 (H) 4.0 - 10.5 K/uL   RBC 4.10 3.87 - 5.11 MIL/uL   Hemoglobin 13.3 12.0 - 15.0 g/dL   HCT 39.8 36.0 - 46.0 %   MCV  97.1 80.0 - 100.0 fL   MCH 32.4 26.0 - 34.0 pg   MCHC 33.4 30.0 - 36.0 g/dL   RDW 12.8 11.5 - 15.5 %   Platelets 245 150 - 400 K/uL   nRBC 0.0 0.0 - 0.2 %  Troponin I (High Sensitivity)     Status: None   Collection Time: 01/10/21 10:19 PM  Result Value Ref Range   Troponin I (High Sensitivity) 3 <18 ng/L   Imaging Studies: DG Chest 2 View  Result Date: 01/10/2021 CLINICAL DATA:  Chest pain EXAM: CHEST - 2 VIEW COMPARISON:  09/21/2020 FINDINGS: The lungs are mildly hyperinflated in keeping with changes of underlying COPD. Small left pleural effusion has developed. No focal pulmonary infiltrate or nodule identified. Nodular opacity over the right apex is likely artifactual and related to monitoring device overlying the right apex. No pneumothorax. Cardiac size within normal limits. Pulmonary vascularity is normal. Osseous structures are age-appropriate. IMPRESSION: COPD.  Small left pleural effusion. Electronically Signed   By: Fidela Salisbury MD   On: 01/10/2021 22:55   CT Angio Chest PE W and/or Wo Contrast  Result Date: 01/11/2021 CLINICAL DATA:  Left rib cage pain for several days, worsening tonight EXAM: CT ANGIOGRAPHY CHEST WITH CONTRAST TECHNIQUE: Multidetector CT imaging of the chest was performed using the standard protocol during bolus administration of intravenous contrast. Multiplanar CT image reconstructions and MIPs were obtained to evaluate the vascular anatomy. CONTRAST:  126mL OMNIPAQUE IOHEXOL 350 MG/ML SOLN COMPARISON:  Coronary CT 04/06/2018, radiograph 01/10/2021 FINDINGS: Cardiovascular: Satisfactory opacification the pulmonary arteries to the segmental level. No pulmonary artery filling defects are identified. Central pulmonary arteries are mildly enlarged. Borderline cardiomegaly with right heart enlargement. Trace pericardial effusion. Some mild stranding in the paracardial fat. Reflux of contrast into the IVC. The aortic root is suboptimally assessed given cardiac pulsation  artifact. Atherosclerotic plaque within the normal caliber aorta and normally branching proximal great vessels. Mediastinum/Nodes: No mediastinal fluid or gas. Normal thyroid gland and thoracic inlet. No acute abnormality of the trachea or esophagus. No worrisome mediastinal, hilar or axillary adenopathy. Lungs/Pleura: Trace left pleural effusion. Some adjacent atelectatic changes are present. Additional dependent ground-glass atelectasis in both lungs. Bandlike areas of opacity in the lung bases likely reflect further subsegmental atelectasis or scarring. No pneumothorax. 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 No acute abnormalities present in the visualized portions of the upper abdomen. Heterogenous splenic enhancement is typical of contrast timing. Musculoskeletal: No visible displaced rib fracture. No acute or suspicious osseous lesions. No acute abnormality of chest wall. Review of the MIP images confirms the above findings. IMPRESSION: 1. No evidence of acute pulmonary artery embolism. 2. Small pericardial effusion with some mild stranding in the paracardial fat is nonspecific but can be seen with pericarditis. Correlate with clinical findings and consider echocardiography as clinically warranted. 3. Trace left pleural effusion with adjacent atelectatic changes. 4. Borderline cardiomegaly with right heart enlargement and reflux of contrast into the IVC, suggestive of right heart dysfunction. 5. 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 6. Aortic Atherosclerosis (ICD10-I70.0). Electronically Signed   By: Lovena Le M.D.   On: 01/11/2021 00:03    ED COURSE and MDM  Nursing notes, initial and subsequent vitals signs, including pulse oximetry, reviewed and interpreted by myself.  Vitals:   01/10/21 2154  01/10/21 2215  BP: 130/65 (!) 138/59  Pulse: 63 64  Resp: 16 15  Temp: 98.1 F (36.7 C)   TempSrc: Oral   SpO2: 100% 100%  Weight: 59 kg   Height: 5\' 6"  (1.676 m)    Medications  ketorolac (TORADOL) 15 MG/ML injection 15 mg (has no administration in time range)  iohexol (OMNIPAQUE) 350 MG/ML injection 100 mL (100 mLs Intravenous Contrast Given 01/10/21 2336)   12:12 AM The patient's presentation is not consistent with pericarditis (there is no change with leaning forward or lying  supine) but the CT suggest the presence of pericarditis.  We will treat with an NSAID which should help both pericarditis and pleuritic pain.  Given the suggestion of right heart dysfunction as well we will refer to cardiology   PROCEDURES  Procedures   ED DIAGNOSES     ICD-10-CM   1. Pleuritic chest pain  R07.81   2. Pericardial effusion without cardiac tamponade  I31.3   3. Pleural effusion on left  J90        Aleia Larocca, Jenny Reichmann, MD 01/11/21 [id number]

## 2021-01-10 NOTE — ED Triage Notes (Signed)
Pt report pain in left ribcage for several days, worse tonight

## 2021-01-11 MED ORDER — KETOROLAC TROMETHAMINE 15 MG/ML IJ SOLN
15.0000 mg | Freq: Once | INTRAMUSCULAR | Status: AC
  Administered 2021-01-11: 15 mg via INTRAVENOUS
  Filled 2021-01-11: qty 1

## 2021-01-11 MED ORDER — NAPROXEN 375 MG PO TABS: ORAL_TABLET | ORAL | 0 refills | Status: DC

## 2021-01-15 NOTE — Progress Notes (Signed)
Cardiology Office Note:   Date:  01/16/2021  NAME:  [name]    MRN: [id number] DOB:  02-04-1946   PCP:  Crist Infante, MD  Cardiologist:  No primary care provider on file.   Referring MD: Crist Infante, MD   Chief Complaint  Patient presents with  . Chest Pain   History of Present Illness:   [name] is a 75 y.o. female with a hx of HTN, HLD who is being seen today for the evaluation of chest pain at the request of Crist Infante, MD. Seen in ER 01/10/2021 for atypical CP. CT PE study negative. Mention of right heart enlargement but not impressive on my review. Needs echo. She was seen in the emergency room last Saturday.  Apparently 2 to 3 days before she developed sharp pain that was worse with breathing in.  She describes it as being underneath her breast.  She reports the pain was not exertional.  It was not alleviated by any other factors other than avoiding deep breathing.  She reports she was given ibuprofen and her symptoms have resolved.  She was then told she had right heart enlargement and a pericardial effusion and need to follow with [place].  I did review her CT scan and I am not that impressed.  She has no history of smoking.  She does have a history of hypertension her BP is elevated today.  Is 180/90.  She reports she was quite stressed driving and this morning.  She reports intense folic as well as uncertainty of our office location.  This created much stress.  I did inform her that she needs an echocardiogram to sort out what is going on.  It is reassuring that her chest pain symptoms have improved.  She reports she does not exercise but has no limitations such as chest pain or shortness of breath.  Again, symptoms have improved.  She has never had a heart attack or stroke.  She is a never smoker.  She does not drink alcohol or use drugs.  She is retired Theme park manager.  She reports her mother and father had heart disease in their older years.  She reports her blood pressure is  normally not this elevated.  Problem List 1. HTN 2. HLD -Total cholesterol 177, HDL 66, LDL 91, triglycerides 98  Past Medical History: Past Medical History:  Diagnosis Date  . Allergy   . Arthritis    osteoarthritis  . Endometriosis   . Fibromyalgia   . Hypertension   . NGB (new growth of bladder)    Noted at routine cystoscopy during prolapse surgery - April 2017.  Urology evaluationn benign and no biopsy needed.  . Osteoporosis   . Osteoporosis, unspecified 06/04/2014   PCP management with medication  . PONV (postoperative nausea and vomiting)   . Skin cancer   . Tremor    benign tremor mainly in neck    Past Surgical History: Past Surgical History:  Procedure Laterality Date  . ANTERIOR AND POSTERIOR REPAIR N/A 02/17/2016   Procedure: ANTERIOR (CYSTOCELE) AND POSTERIOR REPAIR (RECTOCELE); Enterocele repair and lysis of adhesions;  Surgeon: Nunzio Cobbs, MD;  Location: [facility];  Service: Gynecology;  Laterality: N/A;  . APPENDECTOMY    . APPENDECTOMY    . BLADDER SUSPENSION N/A 02/17/2016   Procedure: TRANSVAGINAL TAPE (TVT) PROCEDURE exact midurethral sling;  Surgeon: Nunzio Cobbs, MD;  Location: [facility];  Service: Gynecology;  Laterality: N/A;  . CYSTO N/A 02/17/2016  Procedure: CYSTO;  Surgeon: Nunzio Cobbs, MD;  Location: [facility];  Service: Gynecology;  Laterality: N/A;  . STAPEDES SURGERY     left ear  . TOTAL ABDOMINAL HYSTERECTOMY  1980's    Current Medications: Current Meds  Medication Sig  . Calcium Carb-Cholecalciferol 600-800 MG-UNIT TABS Take 0.5 tablets by mouth 2 (two) times daily.  . Cholecalciferol (VITAMIN D) 2000 units tablet Take 2,000 Units by mouth daily.  Marland Kitchen CINNAMON PO Take 1 capsule by mouth as needed.   . Coenzyme Q10 (CO Q 10 PO) Take by mouth.  . gabapentin (NEURONTIN) 300 MG capsule Take 300 mg by mouth 3 (three) times daily.  Marland Kitchen galantamine (RAZADYNE ER) 16 MG 24 hr capsule Take 16 mg by mouth every  morning.  . irbesartan (AVAPRO) 75 MG tablet TK 1 T PO QD. REPLACES VALSARTAN  . Multiple Vitamin (MULTIVITAMIN) tablet Take 1 tablet by mouth daily.  . naproxen (NAPROSYN) 375 MG tablet Take 1 tablet twice daily for pleuritic chest pain.  . Omega-3 Fatty Acids (FISH OIL) 500 MG CAPS Take 1 capsule by mouth daily.   . pravastatin (PRAVACHOL) 40 MG tablet TK 1 T PO QD  . triamcinolone (NASACORT) 55 MCG/ACT AERO nasal inhaler Place 2 sprays into the nose daily.     Allergies:    Amoxicillin and Eggs or egg-derived products   Social History: Social History   Socioeconomic History  . Marital status: Married    Spouse name: Not on file  . Number of children: 2  . Years of education: Not on file  . Highest education level: Not on file  Occupational History  . Occupation: retired - Emergency planning/management officer  Tobacco Use  . Smoking status: Never Smoker  . Smokeless tobacco: Never Used  Substance and Sexual Activity  . Alcohol use: No    Alcohol/week: 0.0 standard drinks  . Drug use: No  . Sexual activity: Yes    Partners: Male    Birth control/protection: Surgical    Comment: TAH  Other Topics Concern  . Not on file  Social History Narrative  . Not on file   Social Determinants of Health   Financial Resource Strain: Not on file  Food Insecurity: Not on file  Transportation Needs: Not on file  Physical Activity: Not on file  Stress: Not on file  Social Connections: Not on file     Family History: The patient's family history includes Alzheimer's disease in her mother; Heart disease in her father and mother; Hypertension in her father; Osteoporosis in her mother; Stroke in her father. There is no history of Breast cancer.  ROS:   All other ROS reviewed and negative. Pertinent positives noted in the HPI.     EKGs/Labs/Other Studies Reviewed:   The following studies were personally reviewed by me today:  EKG:  EKG is ordered today.  The ekg ordered today demonstrates normal sinus  rhythm heart rate 68, nonspecific ST-T changes, no evidence of infarction, and was personally reviewed by me.   Recent Labs: 09/21/2020: ALT 16 01/10/2021: BUN 16; Creatinine, Ser 0.63; Hemoglobin 13.3; Platelets 245; Potassium 3.7; Sodium 138   Recent Lipid Panel No results found for: CHOL, TRIG, HDL, CHOLHDL, VLDL, LDLCALC, LDLDIRECT  Physical Exam:   VS:  BP (!) 180/90 (BP Location: Left Arm, Patient Position: Sitting)   Pulse 68   Ht 5' 4.5" (1.638 m)   Wt 135 lb 3.2 oz (61.3 kg)   SpO2 97%   BMI 22.85  kg/m    Wt Readings from Last 3 Encounters:  01/16/21 135 lb 3.2 oz (61.3 kg)  01/10/21 130 lb (59 kg)  11/17/20 131 lb (59.4 kg)    General: Well nourished, well developed, in no acute distress Head: Atraumatic, normal size  Eyes: PEERLA, EOMI  Neck: Supple, no JVD Endocrine: No thryomegaly Cardiac: Normal S1, S2; RRR; no murmurs, rubs, or gallops Lungs: Clear to auscultation bilaterally, no wheezing, rhonchi or rales  Abd: Soft, nontender, no hepatomegaly  Ext: No edema, pulses 2+ Musculoskeletal: No deformities, BUE and BLE strength normal and equal Skin: Warm and dry, no rashes   Neuro: Alert and oriented to person, place, time, and situation, CNII-XII grossly intact, no focal deficits  Psych: Normal mood and affect   ASSESSMENT:   [name] is a 75 y.o. female who presents for the following: 1. Chest pain, unspecified type   2. Pericardial effusion     PLAN:   1. Chest pain, unspecified type 2. Pericardial effusion -Evaluated in the emergency room for atypical pleuritic chest pain.  CT PE study negative.  Troponin was negative x1.  Chest x-ray unremarkable.  Her EKG in office demonstrates no acute ischemic changes or evidence of infarction.  Her story is inconsistent with pericarditis but this could represent something on the spectrum of that syndrome.  Her symptoms have improved with a course of ibuprofen.  She is had no further recurrence of symptoms.  She  reports she is doing well without any symptoms.  I am not convinced she has an enlarged right heart.  Her pericardial effusion on my review is not that impressive either.  I have recommended an echocardiogram just to sort this out.  If everything is normal she will see [place] as needed.  Her symptoms have resolved and need no further attention.  Her blood pressure is slightly elevated 180/90.  She reports she is stressed this morning.  She should take her medications at home.  I will notify her of her results by phone.   Disposition: Return if symptoms worsen or fail to improve.  Medication Adjustments/Labs and Tests Ordered: Current medicines are reviewed at length with the patient today.  Concerns regarding medicines are outlined above.  Orders Placed This Encounter  Procedures  . ECHOCARDIOGRAM COMPLETE   No orders of the defined types were placed in this encounter.   Patient Instructions  Medication Instructions:  The current medical regimen is effective;  continue present plan and medications.  *If you need a refill on your cardiac medications before your next appointment, please call your pharmacy*  Testing/Procedures: Echocardiogram - Your physician has requested that you have an echocardiogram. Echocardiography is a painless test that uses sound waves to create images of your heart. It provides your doctor with information about the size and shape of your heart and how well your heart's chambers and valves are working. This procedure takes approximately one hour. There are no restrictions for this procedure. This will be performed at our [facility] location - [address], [address].    Follow-Up: At [facility], you and your health needs are our priority.  As part of our continuing mission to provide you with exceptional heart care, we have created designated Provider Care Teams.  These Care Teams include your primary Cardiologist (physician) and Advanced Practice Providers (APPs  -  Physician Assistants and Nurse Practitioners) who all work together to provide you with the care you need, when you need it.  We  recommend signing up for the patient portal called "MyChart".  Sign up information is provided on this After Visit Summary.  MyChart is used to connect with patients for Virtual Visits (Telemedicine).  Patients are able to view lab/test results, encounter notes, upcoming appointments, etc.  Non-urgent messages can be sent to your provider as well.   To learn more about what you can do with MyChart, go to [email or web address].    Your next appointment:   As needed  The format for your next appointment:   In Person  Provider:   Eleonore Chiquito, MD     Signed, Addison Naegeli. Audie Box, MD, [facility]  [address] [place], [place] [address] [phone]  01/16/2021 9:49 AM

## 2021-01-16 ENCOUNTER — Other Ambulatory Visit: Payer: Self-pay

## 2021-01-16 ENCOUNTER — Ambulatory Visit: Payer: Medicare Other | Admitting: Cardiovascular Disease

## 2021-01-16 ENCOUNTER — Encounter: Payer: Self-pay | Admitting: Cardiovascular Disease

## 2021-01-16 VITALS — BP 180/90 | HR 68 | Ht 64.5 in | Wt 135.2 lb

## 2021-01-16 DIAGNOSIS — I313 Pericardial effusion (noninflammatory): Secondary | ICD-10-CM

## 2021-01-16 DIAGNOSIS — R079 Chest pain, unspecified: Secondary | ICD-10-CM | POA: Diagnosis not present

## 2021-01-16 DIAGNOSIS — I3139 Other pericardial effusion (noninflammatory): Secondary | ICD-10-CM

## 2021-01-16 NOTE — Patient Instructions (Signed)
Medication Instructions:  The current medical regimen is effective;  continue present plan and medications.  *If you need a refill on your cardiac medications before your next appointment, please call your pharmacy*   Testing/Procedures: Echocardiogram - Your physician has requested that you have an echocardiogram. Echocardiography is a painless test that uses sound waves to create images of your heart. It provides your doctor with information about the size and shape of your heart and how well your heart's chambers and valves are working. This procedure takes approximately one hour. There are no restrictions for this procedure. This will be performed at our [address] location - [address], [address].    Follow-Up: At [facility], you and your health needs are our priority.  As part of our continuing mission to provide you with exceptional heart care, we have created designated Provider Care Teams.  These Care Teams include your primary Cardiologist (physician) and Advanced Practice Providers (APPs -  Physician Assistants and Nurse Practitioners) who all work together to provide you with the care you need, when you need it.  We recommend signing up for the patient portal called "MyChart".  Sign up information is provided on this After Visit Summary.  MyChart is used to connect with patients for Virtual Visits (Telemedicine).  Patients are able to view lab/test results, encounter notes, upcoming appointments, etc.  Non-urgent messages can be sent to your provider as well.   To learn more about what you can do with MyChart, go to [email or web address].    Your next appointment:   As needed  The format for your next appointment:   In Person  Provider:   [place] O'Neal, MD      

## 2021-01-23 NOTE — Addendum Note (Signed)
Addended by: Wonda Horner on: 01/23/2021 04:34 PM   Modules accepted: Orders

## 2021-02-11 ENCOUNTER — Ambulatory Visit (HOSPITAL_COMMUNITY): Payer: Medicare Other | Attending: Cardiology

## 2021-02-11 ENCOUNTER — Other Ambulatory Visit: Payer: Self-pay

## 2021-02-11 DIAGNOSIS — I313 Pericardial effusion (noninflammatory): Secondary | ICD-10-CM | POA: Insufficient documentation

## 2021-02-11 DIAGNOSIS — I3139 Other pericardial effusion (noninflammatory): Secondary | ICD-10-CM

## 2021-02-11 LAB — ECHOCARDIOGRAM COMPLETE
Area-P 1/2: 4.26 cm2
S' Lateral: 2.5 cm

## 2021-09-22 ENCOUNTER — Other Ambulatory Visit: Payer: Self-pay | Admitting: Internal Medicine

## 2021-09-22 DIAGNOSIS — Z1231 Encounter for screening mammogram for malignant neoplasm of breast: Secondary | ICD-10-CM

## 2021-10-06 ENCOUNTER — Other Ambulatory Visit (HOSPITAL_BASED_OUTPATIENT_CLINIC_OR_DEPARTMENT_OTHER): Payer: Self-pay

## 2021-10-06 MED ORDER — FLUCELVAX QUADRIVALENT 0.5 ML IM SUSY
PREFILLED_SYRINGE | INTRAMUSCULAR | 0 refills | Status: DC
Start: 2021-10-06 — End: 2023-11-26
  Filled 2021-10-06: qty 0.5, 1d supply, fill #0

## 2021-10-27 ENCOUNTER — Ambulatory Visit
Admission: RE | Admit: 2021-10-27 | Discharge: 2021-10-27 | Disposition: A | Discharge status: Home / Self Care | Payer: Medicare Other | Source: Ambulatory Visit | Attending: Internal Medicine | Admitting: Internal Medicine

## 2021-10-27 DIAGNOSIS — Z1231 Encounter for screening mammogram for malignant neoplasm of breast: Secondary | ICD-10-CM

## 2021-11-20 IMAGING — CT CT ABD-PELV W/ CM
2 of 5 series · 15 of 46 positions shown, 17 images · IV contrast (Omnipaque)
Comparison: None.

CLINICAL DATA: Lower abdominal pain with intermittent constipation
and nausea

EXAM:
CT ABDOMEN AND PELVIS WITH CONTRAST
TECHNIQUE: Multidetector CT imaging of the abdomen and pelvis was performed
using the standard protocol following bolus administration of
intravenous contrast.
CONTRAST:  100mL OMNIPAQUE IOHEXOL 300 MG/ML  SOLN

[Series 2: axial st · axial · 0.76mm/px · z∈[+640,+976]mm · 12 of 77 slices shown, 14 images]
[im 5/77  soft-tissue]
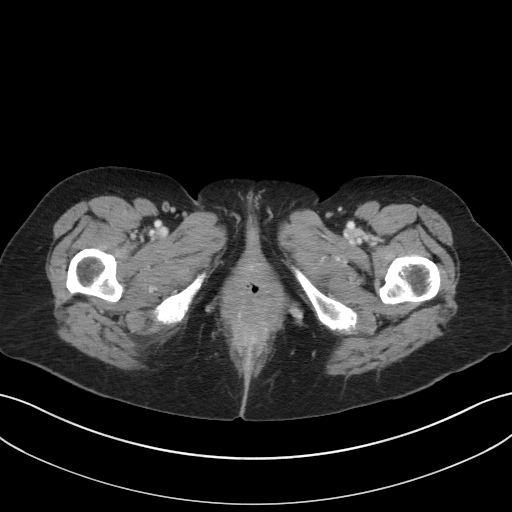
[im 5/77  bone]
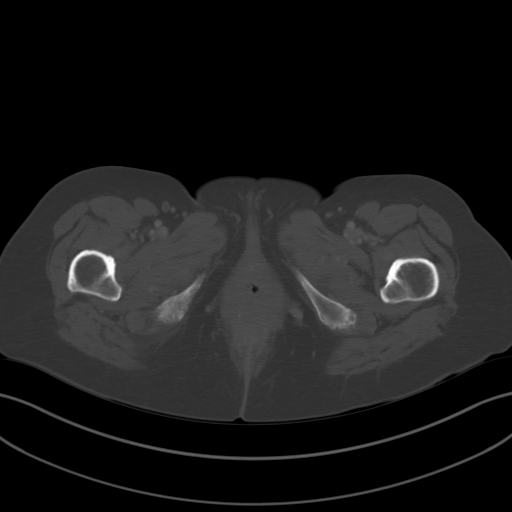
[im 13/77  soft-tissue]
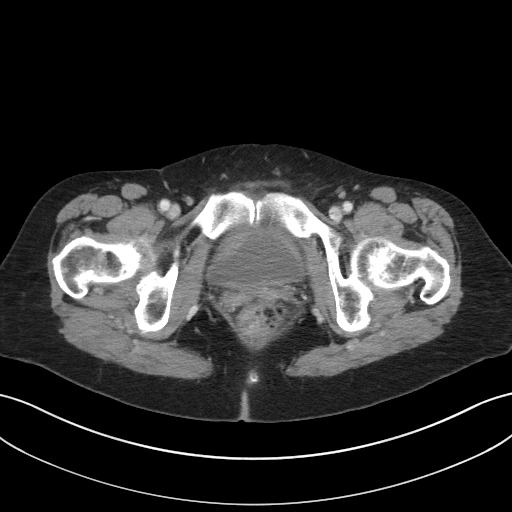
[im 17/77  soft-tissue]
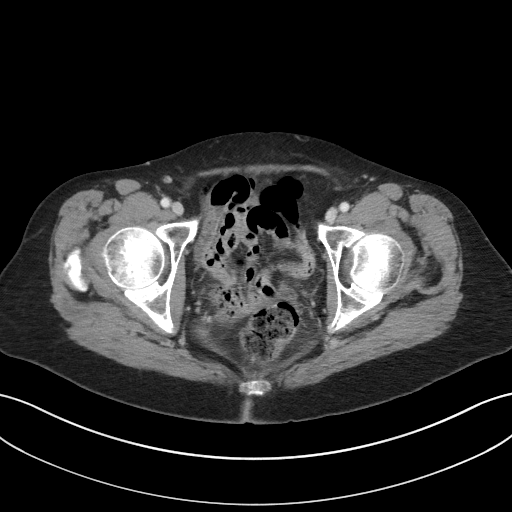
[im 22/77  soft-tissue]
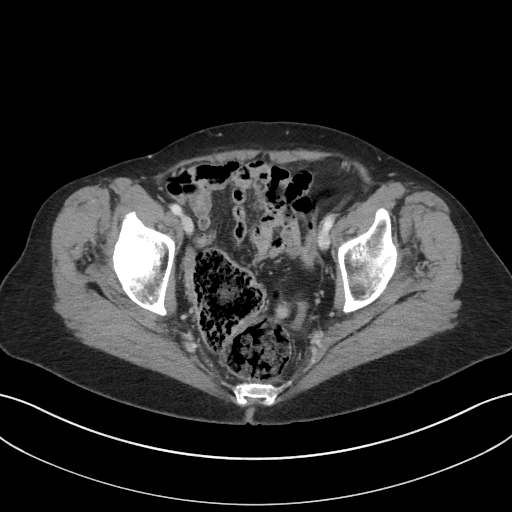
[im 30/77  soft-tissue]
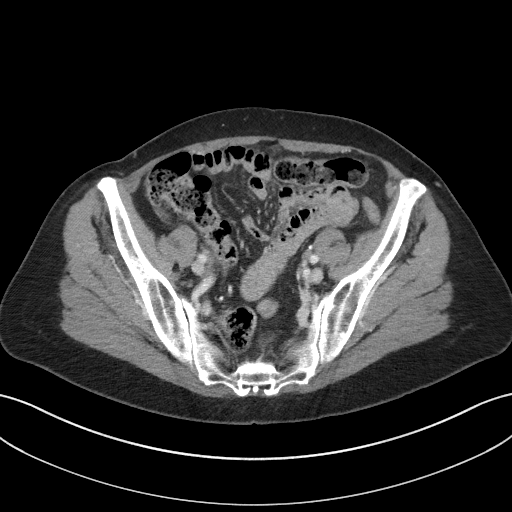
[im 34/77  soft-tissue]
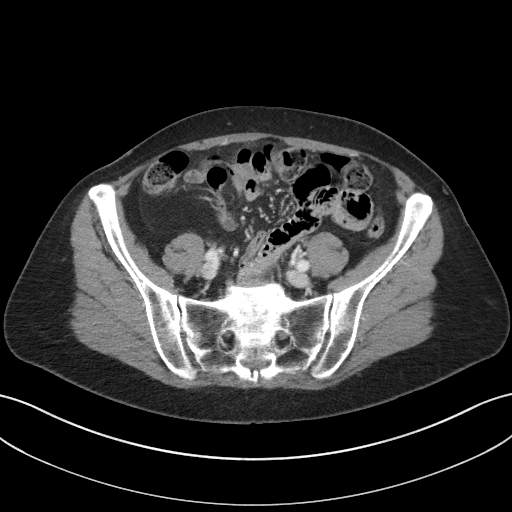
[im 43/77  soft-tissue]
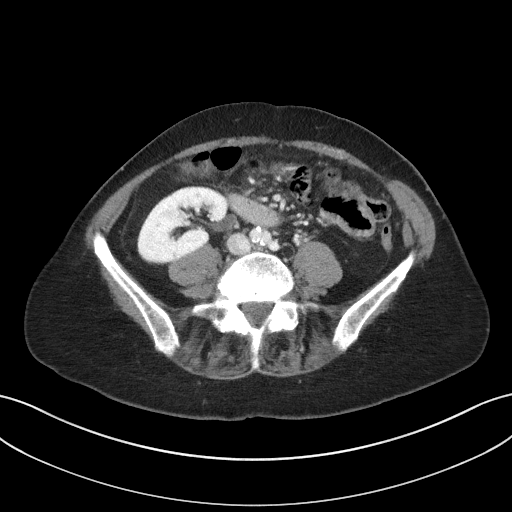
[im 47/77  soft-tissue]
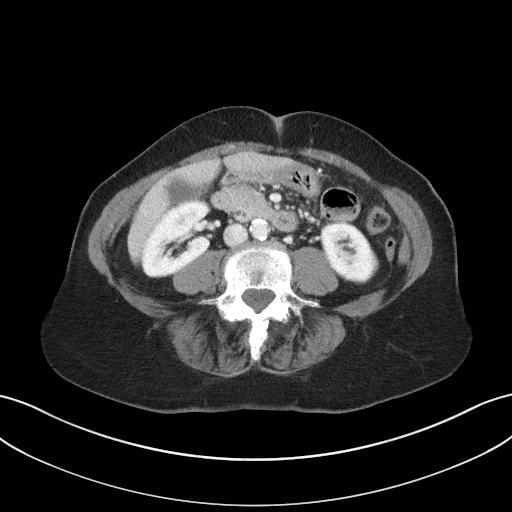
[im 55/77  soft-tissue]
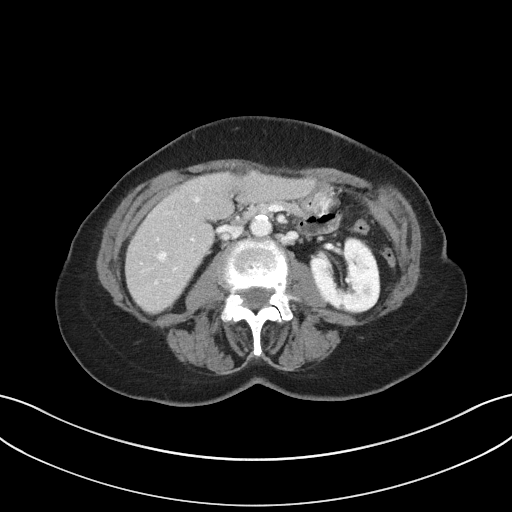
[im 55/77  bone]
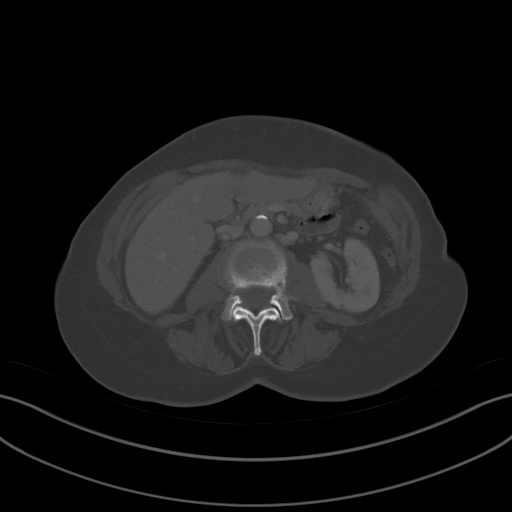
[im 60/77  soft-tissue]
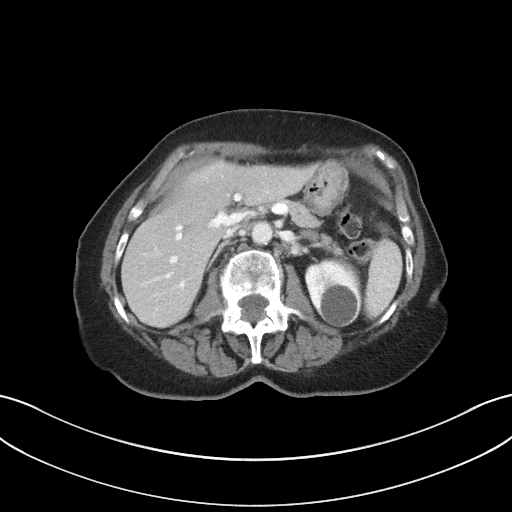
[im 64/77  soft-tissue]
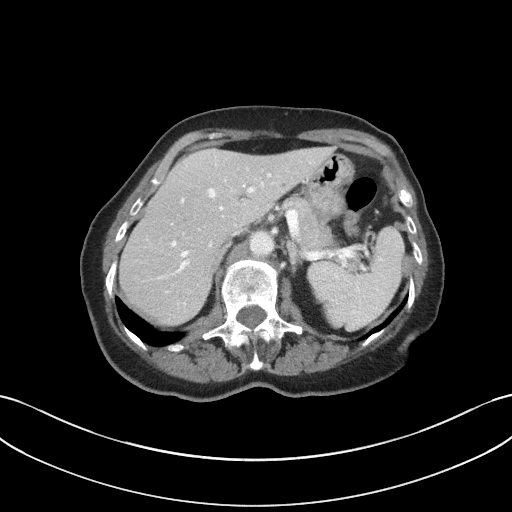
[im 72/77  soft-tissue]
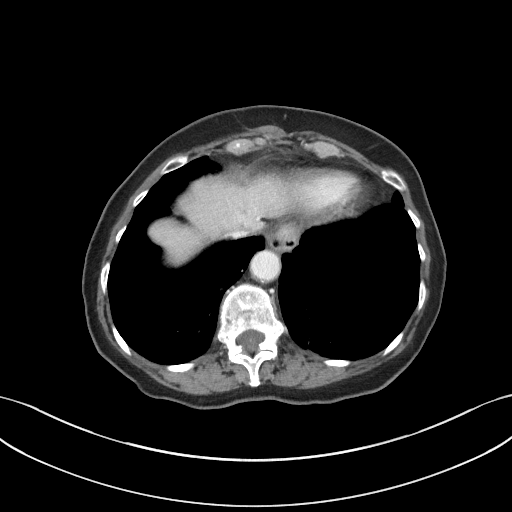

[Series 5: coronal st · coronal · 0.72mm/px · 3 of 80 slices shown]
[im 27/80  soft-tissue]
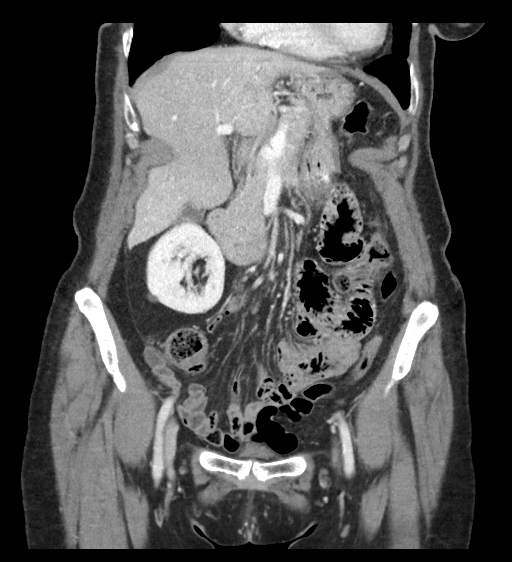
[im 36/80  soft-tissue]
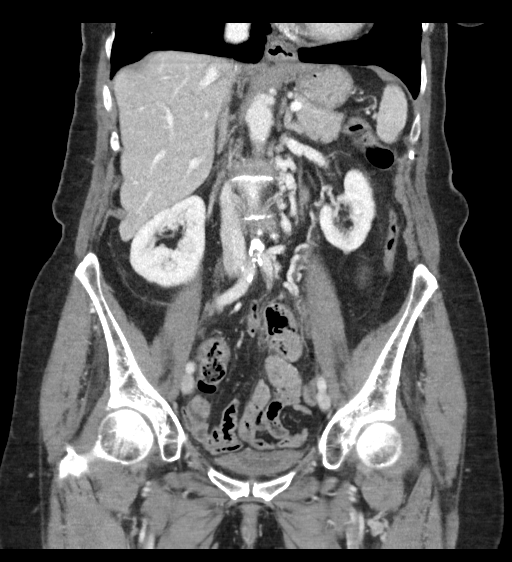
[im 44/80  soft-tissue]
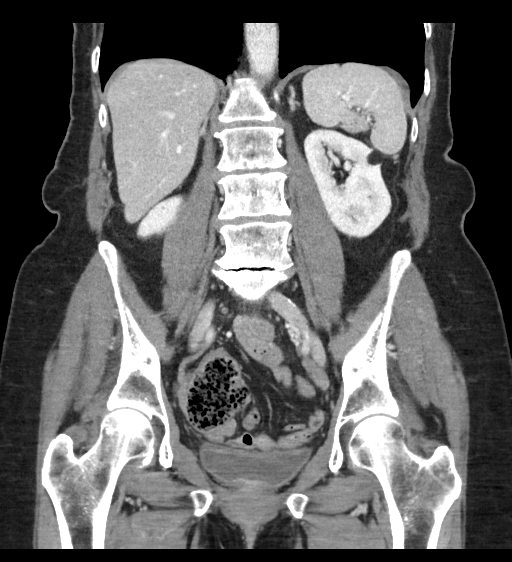

[15 of 46 positions shown; findings below may reference images not displayed]

FINDINGS: Lower chest: There are scattered areas of scarring in the lung
bases. No lung base edema or airspace opacity evident. There is a
small hiatal hernia.

Hepatobiliary: There is a degree of underlying hepatic steatosis.
There are occasional subcentimeter cysts scattered throughout the
liver. Beyond tiny apparent cysts, no focal liver lesions are
evident. Gallbladder wall is not appreciably thickened. There is no
biliary duct dilatation.

Pancreas: There is no pancreatic mass or inflammatory focus.

Spleen: No splenic lesions are evident.

Adrenals/Urinary Tract: Adrenals bilaterally appear normal. There is
a cyst in the upper pole of the left kidney measuring 3.0 x 2.8 cm.
There is an angiomyolipoma in the lateral mid left kidney measuring
1.2 x 1.0 cm. Tiny cysts elsewhere noted in the left kidney. There
is a cyst arising from the lateral aspect of the lower pole left
kidney measuring 0.7 x 0.6 cm. There is no evident hydronephrosis on
either side. There is no evident renal or ureteral calculus on
either side. Urinary bladder is midline with slight thickening of
the urinary bladder wall.

Stomach/Bowel: There is moderate stool throughout the colon. Note
that there is mild distension of the rectum with stool. There is no
appreciable diverticular disease. There is no appreciable bowel wall
or mesenteric thickening. Terminal ileum appears normal. No evident
bowel obstruction. No free air or portal venous air.

Vascular/Lymphatic: There is no abdominal aortic aneurysm. There are
foci of aortic and iliac artery atherosclerosis. Major venous
structures appear patent. No adenopathy is evident in the abdomen or
pelvis.

Reproductive: Uterus is absent.  No adnexal region masses.

Other: Appendix reportedly absent. No periappendiceal region
inflammation. No evident abscess or ascites in the abdomen or
pelvis. There is slight fat in the umbilicus.

Musculoskeletal: There is degenerative change in the lumbar spine.
There is no blastic or lytic bone lesion. There is no appreciable
intramuscular lesion.
IMPRESSION: 1. Rectum mildly distended with stool. Moderate stool overall
throughout the colon. No diverticular disease. No bowel wall
obstruction evident. No evident abscess in the abdomen pelvis.
Appendix reportedly absent. No right lower quadrant inflammation
evident.

2. No renal or ureteral calculi. No hydronephrosis. Urinary bladder
wall appears slightly thickened. Advise correlation with urinalysis
to assess for possible degree of cystitis.

3.  Hepatic steatosis.

4.  Small hiatal hernia.

5. Small benign angiomyolipoma in the left kidney. Several renal
cysts on the left as well.

6.  Aortic Atherosclerosis ([id number]).

7.  Uterus absent.

## 2022-01-27 ENCOUNTER — Other Ambulatory Visit (HOSPITAL_COMMUNITY): Payer: Self-pay | Admitting: Registered Nurse

## 2022-01-27 ENCOUNTER — Ambulatory Visit (HOSPITAL_COMMUNITY)
Admission: RE | Admit: 2022-01-27 | Discharge: 2022-01-27 | Disposition: A | Discharge status: Home / Self Care | Payer: Medicare Other | Source: Ambulatory Visit | Attending: Cardiovascular Disease | Admitting: Cardiovascular Disease

## 2022-01-27 ENCOUNTER — Other Ambulatory Visit: Payer: Self-pay

## 2022-01-27 DIAGNOSIS — R2242 Localized swelling, mass and lump, left lower limb: Secondary | ICD-10-CM | POA: Insufficient documentation

## 2022-09-27 ENCOUNTER — Emergency Department (HOSPITAL_COMMUNITY): Payer: Medicare Other

## 2022-09-27 ENCOUNTER — Encounter (HOSPITAL_COMMUNITY): Payer: Self-pay

## 2022-09-27 ENCOUNTER — Emergency Department (HOSPITAL_COMMUNITY)
Admission: EM | Admit: 2022-09-27 | Discharge: 2022-09-27 | Disposition: A | Discharge status: Home / Self Care | Payer: Medicare Other | Attending: Emergency Medicine | Admitting: Emergency Medicine

## 2022-09-27 DIAGNOSIS — W19XXXA Unspecified fall, initial encounter: Secondary | ICD-10-CM | POA: Insufficient documentation

## 2022-09-27 DIAGNOSIS — R55 Syncope and collapse: Secondary | ICD-10-CM

## 2022-09-27 DIAGNOSIS — I10 Essential (primary) hypertension: Secondary | ICD-10-CM | POA: Insufficient documentation

## 2022-09-27 DIAGNOSIS — Z79899 Other long term (current) drug therapy: Secondary | ICD-10-CM | POA: Diagnosis not present

## 2022-09-27 DIAGNOSIS — F0390 Unspecified dementia without behavioral disturbance: Secondary | ICD-10-CM | POA: Diagnosis not present

## 2022-09-27 DIAGNOSIS — I951 Orthostatic hypotension: Secondary | ICD-10-CM | POA: Insufficient documentation

## 2022-09-27 LAB — BASIC METABOLIC PANEL
Anion gap: 10 (ref 5–15)
BUN: 15 mg/dL (ref 8–23)
CO2: 24 mmol/L (ref 22–32)
Calcium: 9 mg/dL (ref 8.9–10.3)
Chloride: 107 mmol/L (ref 98–111)
Creatinine, Ser: 0.81 mg/dL (ref 0.44–1.00)
GFR, Estimated: >60 mL/min (ref >60)
Glucose, Bld: 104 mg/dL — ABNORMAL HIGH (ref 70–99)
Potassium: 4.8 mmol/L (ref 3.5–5.1)
Sodium: 141 mmol/L (ref 135–145)

## 2022-09-27 LAB — URINALYSIS, ROUTINE W REFLEX MICROSCOPIC
Bilirubin Urine: NEGATIVE
Glucose, UA: NEGATIVE mg/dL
Hgb urine dipstick: NEGATIVE
Ketones, ur: 5 mg/dL — AB
Nitrite: NEGATIVE
Protein, ur: NEGATIVE mg/dL
Specific Gravity, Urine: 1.005 (ref 1.005–1.030)
pH: 7 (ref 5.0–8.0)

## 2022-09-27 LAB — CBC
HCT: 41.2 % (ref 36.0–46.0)
Hemoglobin: 13 g/dL (ref 12.0–15.0)
MCH: 31.8 pg (ref 26.0–34.0)
MCHC: 31.6 g/dL (ref 30.0–36.0)
MCV: 100.7 fL — ABNORMAL HIGH (ref 80.0–100.0)
Platelets: 199 10*3/uL (ref 150–400)
RBC: 4.09 MIL/uL (ref 3.87–5.11)
RDW: 13 % (ref 11.5–15.5)
WBC: 10.1 10*3/uL (ref 4.0–10.5)
nRBC: 0 % (ref 0.0–0.2)

## 2022-09-27 LAB — CBG MONITORING, ED: Glucose-Capillary: 97 mg/dL (ref 70–99)

## 2022-09-27 MED ORDER — LACTATED RINGERS IV BOLUS
1000.0000 mL | Freq: Once | INTRAVENOUS | Status: AC
  Administered 2022-09-27: 1000 mL via INTRAVENOUS

## 2022-09-27 NOTE — ED Triage Notes (Signed)
Pt arrived via EMS, near syncopal event at home. Has been feeling weak at home the last couple days per son. Pt orthostatic on fires arrival, low 31V systolic. Pt with hx of dementia, aox2, alert to person, place, unable to tell year.    BP 134/78 in triage  20G Left hand 500 cc NS given PTA- BP 400Q systolic   CBG 97 in triage.

## 2022-09-27 NOTE — Discharge Instructions (Signed)
In the emergency department for your dizziness and your fall.  Your blood pressure initially dropped when going from laying to sitting for medics and we gave you fluids in the emergency department and your blood pressure and symptoms improved.  You had no signs of severe dehydration, anemia or any injuries from your fall on your work-up.  You should make sure that you are drinking plenty of fluids and you should follow-up with your primary doctor in the next few days to have your symptoms rechecked.  You should return to the emergency department if you are having recurrent dizziness, you pass out, you have chest pain, you have an injury from a fall or if you have any other new or concerning symptoms.

## 2022-09-27 NOTE — ED Provider Notes (Signed)
[facility] DEPT Provider Note   CSN: [id number] Arrival date & time: 09/27/22  1159     History  Chief Complaint  Patient presents with   Near Syncope    Ginelle Bays is a 76 y.o. female.  And is a 76 year old female with a past medical history of dementia ANO x2 at baseline, hypertension presenting to the emergency department with dizziness.  The patient states that she has had a lightheaded type of dizziness for the last few days.  She is unsure if her dizziness gets worse with position changes.  Her son states that her caretaker found her in the bathroom today on the ground and that she must of fallen due to her dizziness.  Son states that they checked her blood pressure at this time and was noted to be in the 93G systolic.  When medics arrived her blood pressure had improved to the 182X systolic but upon sitting her up it dropped to the 93Z systolic.  She did receive 500 cc of fluids by medics in route.  The patient denies any chest pain or shortness of breath, nausea, vomiting or diarrhea but does states she had a decreased appetite.  She denies any dysuria or hematuria.  She denies any headaches or pain from the fall.  She denies any blood thinner use.  The history is provided by the patient, a relative and the EMS personnel.  Near Syncope       Home Medications Prior to Admission medications   Medication Sig Start Date End Date Taking? Authorizing Provider  Calcium Carb-Cholecalciferol 600-800 MG-UNIT TABS Take 0.5 tablets by mouth 2 (two) times daily.    [provider]  Cholecalciferol (VITAMIN D) 2000 units tablet Take 2,000 Units by mouth daily.    [provider]  CINNAMON PO Take 1 capsule by mouth as needed.     [provider]  Coenzyme Q10 (CO Q 10 PO) Take by mouth.    [provider]  gabapentin (NEURONTIN) 300 MG capsule Take 300 mg by mouth 3 (three) times daily.    [provider]   galantamine (RAZADYNE ER) 16 MG 24 hr capsule Take 16 mg by mouth every morning. 11/06/20   [provider]  influenza vaccine (FLUCELVAX QUADRIVALENT) 0.5 ML injection Inject into the muscle. 10/06/21   Carlyle Basques, MD  irbesartan (AVAPRO) 75 MG tablet TK 1 T PO QD. REPLACES VALSARTAN 06/06/17   [provider]  Multiple Vitamin (MULTIVITAMIN) tablet Take 1 tablet by mouth daily.    [provider]  naproxen (NAPROSYN) 375 MG tablet Take 1 tablet twice daily for pleuritic chest pain. 01/11/21   Molpus, John, MD  Omega-3 Fatty Acids (FISH OIL) 500 MG CAPS Take 1 capsule by mouth daily.     [provider]  pravastatin (PRAVACHOL) 40 MG tablet TK 1 T PO QD 08/09/18   [provider]  triamcinolone (NASACORT) 55 MCG/ACT AERO nasal inhaler Place 2 sprays into the nose daily.    [provider]      Allergies    Amoxicillin and Eggs or egg-derived products    Review of Systems   Review of Systems  Cardiovascular:  Positive for near-syncope.    Physical Exam Updated Vital Signs BP (!) 144/59   Pulse (!) 59   Temp 97.7 F (36.5 C) (Oral)   Resp 16   SpO2 99%  Physical Exam Vitals and nursing note reviewed.  Constitutional:  General: She is not in acute distress.    Appearance: Normal appearance.  HENT:     Head: Normocephalic and atraumatic.     Nose: Nose normal.     Mouth/Throat:     Mouth: Mucous membranes are moist.     Pharynx: Oropharynx is clear.  Eyes:     Extraocular Movements: Extraocular movements intact.     Conjunctiva/sclera: Conjunctivae normal.  Neck:     Comments: No midline neck tenderness Cardiovascular:     Rate and Rhythm: Normal rate and regular rhythm.     Pulses: Normal pulses.     Heart sounds: Normal heart sounds.  Pulmonary:     Effort: Pulmonary effort is normal.     Breath sounds: Normal breath sounds.  Abdominal:     General: Abdomen is flat.     Palpations: Abdomen is soft.      Tenderness: There is no abdominal tenderness.  Musculoskeletal:        General: No swelling or deformity. Normal range of motion.     Cervical back: Normal range of motion and neck supple.     Right lower leg: No edema.     Left lower leg: No edema.  Skin:    General: Skin is warm and dry.  Neurological:     General: No focal deficit present.     Mental Status: She is alert. Mental status is at baseline.     Cranial Nerves: No cranial nerve deficit.     Sensory: No sensory deficit.     Motor: No weakness.     Coordination: Coordination normal.     Comments: Oriented to person and place  Psychiatric:        Mood and Affect: Mood normal.        Behavior: Behavior normal.     ED Results / Procedures / Treatments   Labs (all labs ordered are listed, but only abnormal results are displayed) Labs Reviewed  BASIC METABOLIC PANEL - Abnormal; Notable for the following components:      Result Value   Glucose, Bld 104 (*)    All other components within normal limits  CBC - Abnormal; Notable for the following components:   MCV 100.7 (*)    All other components within normal limits  URINALYSIS, ROUTINE W REFLEX MICROSCOPIC - Abnormal; Notable for the following components:   APPearance HAZY (*)    Ketones, ur 5 (*)    Leukocytes,Ua TRACE (*)    Bacteria, UA FEW (*)    All other components within normal limits  CBG MONITORING, ED  CBG MONITORING, ED    EKG EKG Interpretation  Date/Time:  Monday September 27 2022 12:07:29 EST Ventricular Rate:  61 PR Interval:  158 QRS Duration: 99 QT Interval:  442 QTC Calculation: 446 R Axis:   74 Text Interpretation: Sinus rhythm Interpretation limited secondary to artifact No significant change since last tracing Confirmed by Leanord Asal ([id number]) on 09/27/2022 2:33:18 PM  Radiology CT Head Wo Contrast  Result Date: 09/27/2022 CLINICAL DATA:  Trauma EXAM: CT HEAD WITHOUT CONTRAST CT CERVICAL SPINE WITHOUT CONTRAST TECHNIQUE:  Multidetector CT imaging of the head and cervical spine was performed following the standard protocol without intravenous contrast. Multiplanar CT image reconstructions of the cervical spine were also generated. RADIATION DOSE REDUCTION: This exam was performed according to the departmental dose-optimization program which includes automated exposure control, adjustment of the mA and/or kV according to patient size and/or use of iterative reconstruction technique. COMPARISON:  MRI Brain 07/11/20 FINDINGS: CT HEAD FINDINGS Brain: No evidence of acute infarction, hemorrhage, hydrocephalus, extra-axial collection or mass lesion/mass effect. There is a hypodensity in the corona radiata on the left extending inferiorly to the left thalamus, this was likely present on prior MRI dated 07/11/2020. Compared to prior exam there is slight interval increase in size of hte ventricular system, which is favored t obe secondary to interval volume loss. Vascular: No hyperdense vessel or unexpected calcification. Skull: Normal. Negative for fracture or focal lesion. Sinuses/Orbits: Mucosal thickening bilateral frontal, left ethmoid, and left sphenoid sinus. Other: None CT CERVICAL SPINE FINDINGS Alignment: Grade 1 anterolisthesis of C6 on C7 and C7 on T1. Skull base and vertebrae: No acute fracture. No primary bone lesion or focal pathologic process. Soft tissues and spinal canal: No prevertebral fluid or swelling. No visible canal hematoma. Disc levels:  No evidence of high-grade spinal canal stenosis. Upper chest: Negative. Other: None IMPRESSION: CT HEAD: 1. No acute intracranial abnormality. 2. Compared to prior exam from 2021, there is slight interval increase in size of the ventricular system, which is favored to be secondary to interval volume loss. CT CERVICAL SPINE: 1. No acute fracture or traumatic subluxation. Electronically Signed   By: Marin Roberts M.D.   On: 09/27/2022 14:56   CT Cervical Spine Wo Contrast  Result  Date: 09/27/2022 CLINICAL DATA:  Trauma EXAM: CT HEAD WITHOUT CONTRAST CT CERVICAL SPINE WITHOUT CONTRAST TECHNIQUE: Multidetector CT imaging of the head and cervical spine was performed following the standard protocol without intravenous contrast. Multiplanar CT image reconstructions of the cervical spine were also generated. RADIATION DOSE REDUCTION: This exam was performed according to the departmental dose-optimization program which includes automated exposure control, adjustment of the mA and/or kV according to patient size and/or use of iterative reconstruction technique. COMPARISON:  MRI Brain 07/11/20 FINDINGS: CT HEAD FINDINGS Brain: No evidence of acute infarction, hemorrhage, hydrocephalus, extra-axial collection or mass lesion/mass effect. There is a hypodensity in the corona radiata on the left extending inferiorly to the left thalamus, this was likely present on prior MRI dated 07/11/2020. Compared to prior exam there is slight interval increase in size of hte ventricular system, which is favored t obe secondary to interval volume loss. Vascular: No hyperdense vessel or unexpected calcification. Skull: Normal. Negative for fracture or focal lesion. Sinuses/Orbits: Mucosal thickening bilateral frontal, left ethmoid, and left sphenoid sinus. Other: None CT CERVICAL SPINE FINDINGS Alignment: Grade 1 anterolisthesis of C6 on C7 and C7 on T1. Skull base and vertebrae: No acute fracture. No primary bone lesion or focal pathologic process. Soft tissues and spinal canal: No prevertebral fluid or swelling. No visible canal hematoma. Disc levels:  No evidence of high-grade spinal canal stenosis. Upper chest: Negative. Other: None IMPRESSION: CT HEAD: 1. No acute intracranial abnormality. 2. Compared to prior exam from 2021, there is slight interval increase in size of the ventricular system, which is favored to be secondary to interval volume loss. CT CERVICAL SPINE: 1. No acute fracture or traumatic subluxation.  Electronically Signed   By: Marin Roberts M.D.   On: 09/27/2022 14:56   DG Chest 2 View  Result Date: 09/27/2022 CLINICAL DATA:  Syncope EXAM: CHEST - 2 VIEW COMPARISON:  None Available. FINDINGS: Normal mediastinum and cardiac silhouette. Normal pulmonary vasculature. No evidence of effusion, infiltrate, or pneumothorax. No acute bony abnormality. IMPRESSION: No acute cardiopulmonary process. Electronically Signed   By: Suzy Bouchard M.D.   On: 09/27/2022 14:35    Procedures Procedures  Medications Ordered in ED Medications  lactated ringers bolus 1,000 mL (0 mLs Intravenous Stopped 09/27/22 1549)    ED Course/ Medical Decision Making/ A&P Clinical Course as of 09/27/22 1554  Mon Sep 27, 2022  1509 No acute traumatic injuries on CT head or C-spine, chest x-ray within normal range.  Orthostatics were normal after fluid resuscitation.  Urine is pending at this time. [VK]  1527 Only few bacteria and trace leuks in urine and patient denies any dysuria so UTI is unlikely. [VK]  1547 Assessment, the patient's symptoms have resolved and she states that she was asymptomatic during orthostatics.  Patient likely was having orthostatic hypotension earlier today that is resolved with fluids.  She is stable for discharge home with primary care follow-up and was given strict return precautions.  Patient and son are agreeable with the plan. [VK]    Clinical Course User Index [VK] Kemper Durie, DO                           Medical Decision Making This patient presents to the ED with chief complaint(s) of lightheadedness and fall with pertinent past medical history of dementia, hypertension which further complicates the presenting complaint. The complaint involves an extensive differential diagnosis and also carries with it a high risk of complications and morbidity.    The differential diagnosis includes due to patient's age with unwitnessed fall, concerning for possible ICH/mass effect  or cervical spine injury from her fall, she has no other signs of traumatic injury on exam, considering orthostatic hypotension, arrhythmia, electrolyte abnormality, anemia, infection as possible causes of her dizziness  Additional history obtained: Additional history obtained from family and EMS  Records reviewed N/A  ED Course and Reassessment: Upon patient's arrival to the emergency department she is awake and alert at her neurologic baseline and hemodynamically stable.  She will have labs, EKG, urine and chest x-ray performed to evaluate for cause of her dizziness.  She will be given IV fluids and have orthostatics performed.  She will CT head and C-spine performed to evaluate for acute traumatic injury.  No other signs of trauma seen on exam.  Independent labs interpretation:  The following labs were independently interpreted: Within normal range  Independent visualization of imaging: - I independently visualized the following imaging with scope of interpretation limited to determining acute life threatening conditions related to emergency care: CT head/C-spine, chest x-ray, which revealed no acute disease  Consultation: - Consulted or discussed management/test interpretation w/ external professional: N/A  Consideration for admission or further workup: Patient has no emergent conditions requiring admission or further work-up at this time and is stable for discharge with primary care follow-up Social Determinants of health: N/A    Amount and/or Complexity of Data Reviewed Labs: ordered. Radiology: ordered.          Final Clinical Impression(s) / ED Diagnoses Final diagnoses:  Near syncope  Fall, initial encounter  Orthostatic hypotension    Rx / DC Orders ED Discharge Orders     None         Kemper Durie, DO 09/27/22 1554

## 2023-09-16 ENCOUNTER — Encounter: Payer: Self-pay | Admitting: Internal Medicine

## 2023-11-26 ENCOUNTER — Emergency Department (HOSPITAL_COMMUNITY): Payer: Medicare Other

## 2023-11-26 ENCOUNTER — Encounter (HOSPITAL_COMMUNITY): Payer: Self-pay

## 2023-11-26 ENCOUNTER — Other Ambulatory Visit: Payer: Self-pay

## 2023-11-26 ENCOUNTER — Observation Stay (HOSPITAL_COMMUNITY)
Admission: EM | Admit: 2023-11-26 | Discharge: 2023-11-28 | Disposition: A | Discharge status: Skilled Nursing Facility | Payer: Medicare Other | Attending: Internal Medicine | Admitting: Internal Medicine

## 2023-11-26 DIAGNOSIS — I700 Atherosclerosis of aorta: Secondary | ICD-10-CM | POA: Diagnosis not present

## 2023-11-26 DIAGNOSIS — W19XXXA Unspecified fall, initial encounter: Secondary | ICD-10-CM | POA: Insufficient documentation

## 2023-11-26 DIAGNOSIS — R55 Syncope and collapse: Secondary | ICD-10-CM | POA: Insufficient documentation

## 2023-11-26 DIAGNOSIS — G3184 Mild cognitive impairment, so stated: Secondary | ICD-10-CM | POA: Diagnosis not present

## 2023-11-26 DIAGNOSIS — Z1389 Encounter for screening for other disorder: Secondary | ICD-10-CM | POA: Insufficient documentation

## 2023-11-26 DIAGNOSIS — Z79899 Other long term (current) drug therapy: Secondary | ICD-10-CM | POA: Diagnosis not present

## 2023-11-26 DIAGNOSIS — G459 Transient cerebral ischemic attack, unspecified: Secondary | ICD-10-CM | POA: Diagnosis not present

## 2023-11-26 DIAGNOSIS — I639 Cerebral infarction, unspecified: Secondary | ICD-10-CM | POA: Diagnosis present

## 2023-11-26 DIAGNOSIS — R29810 Facial weakness: Secondary | ICD-10-CM | POA: Diagnosis not present

## 2023-11-26 DIAGNOSIS — F0390 Unspecified dementia without behavioral disturbance: Secondary | ICD-10-CM | POA: Diagnosis not present

## 2023-11-26 DIAGNOSIS — I10 Essential (primary) hypertension: Secondary | ICD-10-CM | POA: Insufficient documentation

## 2023-11-26 DIAGNOSIS — E876 Hypokalemia: Secondary | ICD-10-CM | POA: Insufficient documentation

## 2023-11-26 DIAGNOSIS — Z6822 Body mass index (BMI) 22.0-22.9, adult: Secondary | ICD-10-CM | POA: Diagnosis not present

## 2023-11-26 DIAGNOSIS — Z85828 Personal history of other malignant neoplasm of skin: Secondary | ICD-10-CM | POA: Diagnosis not present

## 2023-11-26 DIAGNOSIS — R531 Weakness: Secondary | ICD-10-CM | POA: Diagnosis not present

## 2023-11-26 LAB — URINALYSIS, ROUTINE W REFLEX MICROSCOPIC
Bilirubin Urine: NEGATIVE
Glucose, UA: NEGATIVE mg/dL
Ketones, ur: NEGATIVE mg/dL
Nitrite: POSITIVE — AB
Protein, ur: NEGATIVE mg/dL
Specific Gravity, Urine: 1.005 (ref 1.005–1.030)
pH: 6 (ref 5.0–8.0)

## 2023-11-26 LAB — PROTIME-INR
INR: 1.1 (ref 0.8–1.2)
Prothrombin Time: 14 s (ref 11.4–15.2)

## 2023-11-26 LAB — DIFFERENTIAL
Abs Immature Granulocytes: 0.05 10*3/uL (ref 0.00–0.07)
Basophils Absolute: 0 10*3/uL (ref 0.0–0.1)
Basophils Relative: 0 %
Eosinophils Absolute: 0 10*3/uL (ref 0.0–0.5)
Eosinophils Relative: 0 %
Immature Granulocytes: 0 %
Lymphocytes Relative: 7 %
Lymphs Abs: 0.9 10*3/uL (ref 0.7–4.0)
Monocytes Absolute: 0.8 10*3/uL (ref 0.1–1.0)
Monocytes Relative: 6 %
Neutro Abs: 11.2 10*3/uL — ABNORMAL HIGH (ref 1.7–7.7)
Neutrophils Relative %: 87 %

## 2023-11-26 LAB — COMPREHENSIVE METABOLIC PANEL
ALT: 48 U/L — ABNORMAL HIGH (ref 0–44)
AST: 138 U/L — ABNORMAL HIGH (ref 15–41)
Albumin: 4 g/dL (ref 3.5–5.0)
Alkaline Phosphatase: 81 U/L (ref 38–126)
Anion gap: 9 (ref 5–15)
BUN: 17 mg/dL (ref 8–23)
CO2: 24 mmol/L (ref 22–32)
Calcium: 9.3 mg/dL (ref 8.9–10.3)
Chloride: 103 mmol/L (ref 98–111)
Creatinine, Ser: 0.55 mg/dL (ref 0.44–1.00)
GFR, Estimated: >60 mL/min (ref >60)
Glucose, Bld: 139 mg/dL — ABNORMAL HIGH (ref 70–99)
Potassium: 3.4 mmol/L — ABNORMAL LOW (ref 3.5–5.1)
Sodium: 136 mmol/L (ref 135–145)
Total Bilirubin: 1.5 mg/dL — ABNORMAL HIGH (ref 0.0–1.2)
Total Protein: 7 g/dL (ref 6.5–8.1)

## 2023-11-26 LAB — CBC
HCT: 39.7 % (ref 36.0–46.0)
Hemoglobin: 13.2 g/dL (ref 12.0–15.0)
MCH: 31.7 pg (ref 26.0–34.0)
MCHC: 33.2 g/dL (ref 30.0–36.0)
MCV: 95.2 fL (ref 80.0–100.0)
Platelets: 220 10*3/uL (ref 150–400)
RBC: 4.17 MIL/uL (ref 3.87–5.11)
RDW: 13.4 % (ref 11.5–15.5)
WBC: 12.9 10*3/uL — ABNORMAL HIGH (ref 4.0–10.5)
nRBC: 0 % (ref 0.0–0.2)

## 2023-11-26 LAB — RAPID URINE DRUG SCREEN, HOSP PERFORMED
Amphetamines: NOT DETECTED
Barbiturates: NOT DETECTED
Benzodiazepines: NOT DETECTED
Cocaine: NOT DETECTED
Opiates: NOT DETECTED
Tetrahydrocannabinol: NOT DETECTED

## 2023-11-26 LAB — HEMOGLOBIN A1C
Hgb A1c MFr Bld: 5.7 % — ABNORMAL HIGH (ref 4.8–5.6)
Mean Plasma Glucose: 116.89 mg/dL

## 2023-11-26 LAB — ETHANOL: Alcohol, Ethyl (B): <10 mg/dL (ref <10)

## 2023-11-26 LAB — CBG MONITORING, ED: Glucose-Capillary: 123 mg/dL — ABNORMAL HIGH (ref 70–99)

## 2023-11-26 LAB — APTT: aPTT: 29 s (ref 24–36)

## 2023-11-26 MED ORDER — ENOXAPARIN SODIUM 40 MG/0.4ML IJ SOSY
40.0000 mg | PREFILLED_SYRINGE | INTRAMUSCULAR | Status: DC
  Administered 2023-11-26 – 2023-11-27 (×2): 40 mg via SUBCUTANEOUS
  Filled 2023-11-26 (×2): qty 0.4

## 2023-11-26 MED ORDER — ACETAMINOPHEN 650 MG RE SUPP: 650.0000 mg | RECTAL | Status: DC | PRN

## 2023-11-26 MED ORDER — STROKE: EARLY STAGES OF RECOVERY BOOK
Freq: Once | Status: AC
Start: 2023-11-27 — End: 2023-11-27
  Filled 2023-11-26: qty 1

## 2023-11-26 MED ORDER — POTASSIUM CHLORIDE CRYS ER 20 MEQ PO TBCR
40.0000 meq | EXTENDED_RELEASE_TABLET | Freq: Once | ORAL | Status: AC
  Administered 2023-11-26: 40 meq via ORAL
  Filled 2023-11-26: qty 2

## 2023-11-26 MED ORDER — ASPIRIN 81 MG PO TBEC
81.0000 mg | DELAYED_RELEASE_TABLET | Freq: Every day | ORAL | Status: DC
  Administered 2023-11-26 – 2023-11-28 (×3): 81 mg via ORAL
  Filled 2023-11-26 (×3): qty 1

## 2023-11-26 MED ORDER — SERTRALINE HCL 50 MG PO TABS
50.0000 mg | ORAL_TABLET | Freq: Every day | ORAL | Status: DC
Start: 2023-11-27 — End: 2023-11-28
  Administered 2023-11-27 – 2023-11-28 (×2): 50 mg via ORAL
  Filled 2023-11-26 (×2): qty 1

## 2023-11-26 MED ORDER — ACETAMINOPHEN 160 MG/5ML PO SOLN: 650.0000 mg | ORAL | Status: DC | PRN

## 2023-11-26 MED ORDER — GALANTAMINE HYDROBROMIDE ER 8 MG PO CP24
16.0000 mg | ORAL_CAPSULE | Freq: Every morning | ORAL | Status: DC
  Administered 2023-11-28: 16 mg via ORAL
  Filled 2023-11-26 (×2): qty 2

## 2023-11-26 MED ORDER — ACETAMINOPHEN 325 MG PO TABS: 650.0000 mg | ORAL_TABLET | ORAL | Status: DC | PRN

## 2023-11-26 MED ORDER — PRAVASTATIN SODIUM 40 MG PO TABS
40.0000 mg | ORAL_TABLET | Freq: Every day | ORAL | Status: DC
  Administered 2023-11-26 – 2023-11-27 (×2): 40 mg via ORAL
  Filled 2023-11-26: qty 2
  Filled 2023-11-26: qty 1

## 2023-11-26 MED ORDER — CLOPIDOGREL BISULFATE 75 MG PO TABS
75.0000 mg | ORAL_TABLET | Freq: Every day | ORAL | Status: DC
  Administered 2023-11-26 – 2023-11-28 (×3): 75 mg via ORAL
  Filled 2023-11-26 (×5): qty 1

## 2023-11-26 MED ORDER — SENNOSIDES-DOCUSATE SODIUM 8.6-50 MG PO TABS: 1.0000 | ORAL_TABLET | Freq: Every evening | ORAL | Status: DC | PRN

## 2023-11-26 MED ORDER — ACETAMINOPHEN 325 MG PO TABS: 650.0000 mg | ORAL_TABLET | Freq: Four times a day (QID) | ORAL | Status: DC | PRN

## 2023-11-26 NOTE — Progress Notes (Signed)
Speech Language Pathology    Patient Details Name: [name] MRN: [id number] DOB: 10/17/1946 Today's Date: 11/26/2023 Time: 5284-1324 SLP Time Calculation (min) (ACUTE ONLY): 12 min   Pt seen for swallow eval and recommended regular, thin liquids, no follow up- see eval for details.  A speech-language-cognitive assessment ordered. Discussed pt's prior and current status with son. Her speech is intelligible, language appears WNL's. Son states she was more confused than normal this morning. Pt has dementia and lives in an assistive living facility. She is not oriented at baseline and has care at ALF who manages her medications. Son agrees that full cognitive assessment is not warranted at this time.     Royce Macadamia  11/26/2023, 4:31 PM

## 2023-11-26 NOTE — Progress Notes (Signed)
Code stroke activated at 1235. LKWT 0830 this morning, pt was found on floor at 1100 with right sided weakness. Dr. Selina Cooley joined tele-neuro cart for exam at 1246. Pt taken to CT at 1300 after exam. Pt back from CT at 1309. Son at bedside. Dr. Selina Cooley reported no bleeding on non-contrast head CT. Pt to be transferred to [facility] for further workup.

## 2023-11-26 NOTE — Consult Note (Signed)
NEUROLOGY TELESTROKE CONSULT NOTE   Date of service: November 26, 2023 Patient Name: [name] MRN:  [id number] DOB:  03/11/1946 Chief Complaint: stroke-like sx Requesting Provider: Loetta Rough, MD  Consult Participants: myself, patient, family member, ED RN, telestroke RN Location of the provider: [facility] Location of the patient: [facility]  This consult was provided via telemedicine with 2-way video and audio communication. The patient/family was informed that care would be provided in this way and agreed to receive care in this manner.   History of Present Illness   This is a 78 year old woman with past medical history significant for hypertension, osteoporosis, skin cancer, and tremor who presents after acute onset of mild right sided weakness, leaning to the right, and right facial droop.  LKW 0830. Her exam had significantly improved by the time she got to the ED.  On my examination, NIHSS = 4 for questions, best language, and mild facial droop. Family member at bedside states her disorientation and mild aphasia are baseline and the only thing abnormal for her is the mild right facial droop. Head CT personally reviewed and showed no ICH or other acute finding. TNK was not administered bc sx were too mild to treat. CTA was not performed as part of the stroke code bc exam was not c/w LVO.  Modified rankin score: 3-Moderate disability-requires help but walks WITHOUT assistance IV Thrombolysis: no, see above   NIHSS components Score: Comment  1a Level of Conscious 0[x]  1[]  2[]  3[]      1b LOC Questions 0[]  1[]  2[x]       1c LOC Commands 0[x]  1[]  2[]       2 Best Gaze 0[x]  1[]  2[]       3 Visual 0[x]  1[]  2[]  3[]      4 Facial Palsy 0[]  1[x]  2[]  3[]      5a Motor Arm - left 0[x]  1[]  2[]  3[]  4[]  UN[]    5b Motor Arm - Right 0[x]  1[]  2[]  3[]  4[]  UN[]    6a Motor Leg - Left 0[x]  1[]  2[]  3[]  4[]  UN[]    6b Motor Leg - Right 0[x]  1[]  2[]  3[]  4[]  UN[]    7 Limb Ataxia 0[x]  1[]  2[]  3[]  UN[]     8  Sensory 0[x]  1[]  2[]  UN[]      9 Best Language 0[]  1[x]  2[]  3[]      10 Dysarthria 0[x]  1[]  2[]  UN[]      11 Extinct. and Inattention 0[x]  1[]  2[]       TOTAL:  4      ROS   Unable to ascertain due to baseline cognitive impairment  Past History   Past Medical History:  Diagnosis Date   Allergy    Arthritis    osteoarthritis   Endometriosis    Fibromyalgia    Hypertension    NGB (new growth of bladder)    Noted at routine cystoscopy during prolapse surgery - April 2017.  Urology evaluationn benign and no biopsy needed.   Osteoporosis    Osteoporosis, unspecified 06/04/2014   PCP management with medication   PONV (postoperative nausea and vomiting)    Skin cancer    Tremor    benign tremor mainly in neck    Past Surgical History:  Procedure Laterality Date   ANTERIOR AND POSTERIOR REPAIR N/A 02/17/2016   Procedure: ANTERIOR (CYSTOCELE) AND POSTERIOR REPAIR (RECTOCELE); Enterocele repair and lysis of adhesions;  Surgeon: Patton Salles, MD;  Location: [facility];  Service: Gynecology;  Laterality: N/A;   APPENDECTOMY     APPENDECTOMY  BLADDER SUSPENSION N/A 02/17/2016   Procedure: TRANSVAGINAL TAPE (TVT) PROCEDURE exact midurethral sling;  Surgeon: Patton Salles, MD;  Location: [facility];  Service: Gynecology;  Laterality: N/A;   CYSTO N/A 02/17/2016   Procedure: Clearnce Sorrel;  Surgeon: Patton Salles, MD;  Location: [facility];  Service: Gynecology;  Laterality: N/A;   STAPEDES SURGERY     left ear   TOTAL ABDOMINAL HYSTERECTOMY  1980's    Family History: Family History  Problem Relation Age of Onset   Heart disease Father    Stroke Father    Hypertension Father    Alzheimer's disease Mother    Osteoporosis Mother    Heart disease Mother    Breast cancer Neg Hx     Social History  reports that she has never smoked. She has never used smokeless tobacco. She reports that she does not drink alcohol and does not use drugs.  Allergies  Allergen  Reactions   Amoxicillin Rash   Egg-Derived Products Hives and Swelling    Egg whites cause swelling and hives. Pt states she can eat eggs cooked.    Medications  No current facility-administered medications for this encounter.  Current Outpatient Medications:    Calcium Carb-Cholecalciferol 600-800 MG-UNIT TABS, Take 0.5 tablets by mouth 2 (two) times daily., Disp: , Rfl:    Cholecalciferol (VITAMIN D) 2000 units tablet, Take 2,000 Units by mouth daily., Disp: , Rfl:    CINNAMON PO, Take 1 capsule by mouth as needed. , Disp: , Rfl:    Coenzyme Q10 (CO Q 10 PO), Take by mouth., Disp: , Rfl:    gabapentin (NEURONTIN) 300 MG capsule, Take 300 mg by mouth 3 (three) times daily., Disp: , Rfl:    galantamine (RAZADYNE ER) 16 MG 24 hr capsule, Take 16 mg by mouth every morning., Disp: , Rfl:    influenza vaccine (FLUCELVAX QUADRIVALENT) 0.5 ML injection, Inject into the muscle., Disp: 0.5 mL, Rfl: 0   irbesartan (AVAPRO) 75 MG tablet, TK 1 T PO QD. REPLACES VALSARTAN, Disp: , Rfl: 11   Multiple Vitamin (MULTIVITAMIN) tablet, Take 1 tablet by mouth daily., Disp: , Rfl:    naproxen (NAPROSYN) 375 MG tablet, Take 1 tablet twice daily for pleuritic chest pain., Disp: 20 tablet, Rfl: 0   Omega-3 Fatty Acids (FISH OIL) 500 MG CAPS, Take 1 capsule by mouth daily. , Disp: , Rfl:    pravastatin (PRAVACHOL) 40 MG tablet, TK 1 T PO QD, Disp: , Rfl: 6   triamcinolone (NASACORT) 55 MCG/ACT AERO nasal inhaler, Place 2 sprays into the nose daily., Disp: , Rfl:   Vitals   Vitals:   11/26/23 1238 11/26/23 1240 11/26/23 1244  BP:  107/65   Pulse:  89   Resp:  18   Temp:   98.1 F (36.7 C)  TempSrc:  Oral Oral  SpO2:  98%   Weight: 61.3 kg    Height: 5' 4.5" (1.638 m)      Body mass index is 22.84 kg/m.  Physical Exam   Physical Exam Gen: A&O x1, NAD Resp: normal WOB CV: extremities appear well-perfused  Neurologic Examination   Neuro: *MS: A&O x1 to self and family member at bedside.  Follows single commaneds *Speech: nondysarthric, no aphasia, able to name and repeat *CN: PERRL 3mm, EOMI, UTA VF 2/2 confusion, sensation intact, mild R lower facial droop, hearing intact to voice *Motor:   Normal bulk.  No tremor, rigidity or bradykinesia. No drift in any  extremity. *Sensory: SILT. Symmetric. No double-simultaneous extinction.  *Coordination:  FNF intact bilat *Reflexes:  UTA 2/2 tele-exam *Gait: deferred  Labs/Imaging/Neurodiagnostic studies   CBC: No results for input(s): "WBC", "NEUTROABS", "HGB", "HCT", "MCV", "PLT" in the last 168 hours. Basic Metabolic Panel:  Lab Results  Component Value Date   NA 141 09/27/2022   K 4.8 09/27/2022   CO2 24 09/27/2022   GLUCOSE 104 (H) 09/27/2022   BUN 15 09/27/2022   CREATININE 0.81 09/27/2022   CALCIUM 9.0 09/27/2022   GFRNONAA >60 09/27/2022   GFRAA >60 02/18/2016   Lipid Panel: No results found for: "LDLCALC" HgbA1c: No results found for: "HGBA1C" Urine Drug Screen: No results found for: "LABOPIA", "COCAINSCRNUR", "LABBENZ", "AMPHETMU", "THCU", "LABBARB"  Alcohol Level No results found for: "ETH" INR No results found for: "INR" APTT No results found for: "APTT" AED levels: No results found for: "PHENYTOIN", "ZONISAMIDE", "LAMOTRIGINE", "LEVETIRACETA"  CT Head without contrast(Personally reviewed): No ICH or other acute process   ASSESSMENT   This is a 78 year old woman with past medical history significant for hypertension, osteoporosis, skin cancer, and tremor who presents after acute onset of mild right sided weakness, leaning to the right, and right facial droop.  LKW 0830. Her exam had significantly improved by the time she got to the ED and by my tele-exam her husband said the only thing now different from her baseline is a new mild right facial droop. Head CT unremarkable. Did not give TNK bc sx were too mild to treat. Presentation is c/f TIA vs small ischemic stroke.  RECOMMENDATIONS   - Admit to [facility] for  stroke workup - Permissive HTN x48 hrs from sx onset or until stroke ruled out by MRI goal BP <220/110. PRN labetalol or hydralazine if BP above these parameters. Avoid oral antihypertensives. - MRI brain wo contrast - CTA/MRA if not already obtained - TTE - Check A1c and LDL + add statin per guidelines - ASA 81mg  daily + plavix 75mg  daily x21 days f/b ASA 81mg  daily monotherapy after that - q4 hr neuro checks - STAT head CT for any change in neuro exam - Tele - PT/OT/SLP - Stroke education - Amb referral to neurology upon discharge   Please notify neurology upon patient's arrival to [facility]; our stroke team will follow her there. ______________________________________________________________________    Signed, Jefferson Fuel, MD [facility]

## 2023-11-26 NOTE — ED Notes (Signed)
Admitting MD at [facility].  

## 2023-11-26 NOTE — ED Notes (Signed)
Currently eating, NAD, calm, interactive, family at [facility].

## 2023-11-26 NOTE — ED Notes (Addendum)
[facility] Neuro exam (Dr. Selina Cooley) in process

## 2023-11-26 NOTE — ED Notes (Signed)
Moved to regular hospital bed. No changes. Family at [facility].

## 2023-11-26 NOTE — Evaluation (Signed)
Clinical/Bedside Swallow Evaluation Patient Details  Name: [name] MRN: [id number] Date of Birth: May 29, 1946  Today's Date: 11/26/2023 Time: SLP Start Time (ACUTE ONLY): 1558 SLP Stop Time (ACUTE ONLY): 1610 SLP Time Calculation (min) (ACUTE ONLY): 12 min  Past Medical History:  Past Medical History:  Diagnosis Date   Allergy    Arthritis    osteoarthritis   Endometriosis    Fibromyalgia    Hypertension    NGB (new growth of bladder)    Noted at routine cystoscopy during prolapse surgery - April 2017.  Urology evaluationn benign and no biopsy needed.   Osteoporosis    Osteoporosis, unspecified 06/04/2014   PCP management with medication   PONV (postoperative nausea and vomiting)    Skin cancer    Tremor    benign tremor mainly in neck   Past Surgical History:  Past Surgical History:  Procedure Laterality Date   ANTERIOR AND POSTERIOR REPAIR N/A 02/17/2016   Procedure: ANTERIOR (CYSTOCELE) AND POSTERIOR REPAIR (RECTOCELE); Enterocele repair and lysis of adhesions;  Surgeon: Patton Salles, MD;  Location: [facility];  Service: Gynecology;  Laterality: N/A;   APPENDECTOMY     APPENDECTOMY     BLADDER SUSPENSION N/A 02/17/2016   Procedure: TRANSVAGINAL TAPE (TVT) PROCEDURE exact midurethral sling;  Surgeon: Patton Salles, MD;  Location: [facility];  Service: Gynecology;  Laterality: N/A;   CYSTO N/A 02/17/2016   Procedure: Clearnce Sorrel;  Surgeon: Patton Salles, MD;  Location: [facility];  Service: Gynecology;  Laterality: N/A;   STAPEDES SURGERY     left ear   TOTAL ABDOMINAL HYSTERECTOMY  1980's   HPI:  [name] is a 78 y.o. female with medical history of dementia and hypertension who lives in assisted living facility, found on the floor and son noted that she was more confused than usual and possibly leaning to the right. Head CT negative. MRI pending.    Assessment / Plan / Recommendation  Clinical Impression  Pt demonstrated normal oropharyngeal  swallow. Son and pt denied prior difficulties. Her dentition is intact, demonstrated strong volitional cough and oromotor abilities are WFL's. She has dementia at baseline but was able to follow commands. She consumed multiple consecutive sips thin water via straw without s/s aspiration. Oral control and  mastication with solid texture was timely with complete clearance. Continued to exhibit safe and efficient swallow with additional sips thin at end of evaluation. Recommend regular texture, thin liquids, pills with thin and may need assist with feeding as she has an essential tremor. No follow up ST is needed at this time. SLP Visit Diagnosis: Dysphagia, unspecified (R13.10)    Aspiration Risk  Mild aspiration risk    Diet Recommendation Regular;Thin liquid    Liquid Administration via: Straw;Cup Medication Administration: Whole meds with liquid Supervision: Comment (may need assist due to essential tremor) Compensations: Slow rate;Small sips/bites Postural Changes: Seated upright at 90 degrees    Other  Recommendations Oral Care Recommendations: Oral care BID    Recommendations for follow up therapy are one component of a multi-disciplinary discharge planning process, led by the attending physician.  Recommendations may be updated based on patient status, additional functional criteria and insurance authorization.  Follow up Recommendations No SLP follow up      Assistance Recommended at Discharge    Functional Status Assessment Patient has not had a recent decline in their functional status  Frequency and Duration  Prognosis        Swallow Study   General Date of Onset: 11/26/23 HPI: [name] is a 78 y.o. female with medical history of dementia and hypertension who lives in assisted living facility, found on the floor and son noted that she was more confused than usual and possibly leaning to the right. Head CT negative. MRI pending. Type of Study: Bedside  Swallow Evaluation Previous Swallow Assessment:  (none) Diet Prior to this Study: NPO Temperature Spikes Noted: No Respiratory Status: Room air History of Recent Intubation: No Behavior/Cognition: Alert;Cooperative;Pleasant mood Oral Cavity Assessment: Within Functional Limits Oral Care Completed by SLP: No Oral Cavity - Dentition: Adequate natural dentition Vision: Functional for self-feeding Self-Feeding Abilities: Needs assist Patient Positioning: Upright in bed Baseline Vocal Quality: Normal Volitional Cough: Strong Volitional Swallow: Able to elicit    Oral/Motor/Sensory Function Overall Oral Motor/Sensory Function: Within functional limits   Ice Chips Ice chips: Not tested   Thin Liquid Thin Liquid: Within functional limits Presentation: Cup;Straw    Nectar Thick Nectar Thick Liquid: Not tested   Honey Thick Honey Thick Liquid: Not tested   Puree Puree: Within functional limits   Solid     Solid: Within functional limits      Royce Macadamia 11/26/2023,4:27 PM

## 2023-11-26 NOTE — ED Notes (Addendum)
Pt d/w SLP ([facility], SLP), pending eval as soon as able

## 2023-11-26 NOTE — ED Provider Notes (Signed)
[place] EMERGENCY DEPARTMENT AT [facility] Provider Note   CSN: [id number] Arrival date & time: 11/26/23  1230     History {Add pertinent medical, surgical, social history, OB history to HPI:1} Chief Complaint  Patient presents with   Fall   Facial Droop    [name] is a 78 y.o. female with PMH as listed below who presents via EMS accompanied by her son.  Patient was last seen normal at 8:30 AM when the staff at the facility gave her medicines.  At approximately 11 AM staff walked by her apartment and noticed that she was lying on the ground.  Noted to have a little right sided facial droop and right-sided extremity weakness.  Patient states that she tried to get up and did not know what happened but she fell.  She is not hurting anywhere including her head or her neck.  No chest pain, dizziness, abdominal pain, nausea any diarrhea constipation, urinary symptoms.  On arrival to the ED patient was stroke coded and evaluated by teleneurology.  On my evaluation of the patient I did not notice any overt right sided weakness, possible mild right-sided facial droop.  Patient does not taking blood thinners, no history of CVA.   Past Medical History:  Diagnosis Date   Allergy    Arthritis    osteoarthritis   Endometriosis    Fibromyalgia    Hypertension    NGB (new growth of bladder)    Noted at routine cystoscopy during prolapse surgery - April 2017.  Urology evaluationn benign and no biopsy needed.   Osteoporosis    Osteoporosis, unspecified 06/04/2014   PCP management with medication   PONV (postoperative nausea and vomiting)    Skin cancer    Tremor    benign tremor mainly in neck       Home Medications Prior to Admission medications   Medication Sig Start Date End Date Taking? Authorizing Provider  Calcium Carb-Cholecalciferol 600-800 MG-UNIT TABS Take 0.5 tablets by mouth 2 (two) times daily.    [provider]  Cholecalciferol (VITAMIN D) 2000  units tablet Take 2,000 Units by mouth daily.    [provider]  CINNAMON PO Take 1 capsule by mouth as needed.     [provider]  Coenzyme Q10 (CO Q 10 PO) Take by mouth.    [provider]  gabapentin (NEURONTIN) 300 MG capsule Take 300 mg by mouth 3 (three) times daily.    [provider]  galantamine (RAZADYNE ER) 16 MG 24 hr capsule Take 16 mg by mouth every morning. 11/06/20   [provider]  influenza vaccine (FLUCELVAX QUADRIVALENT) 0.5 ML injection Inject into the muscle. 10/06/21   Judyann Munson, MD  irbesartan (AVAPRO) 75 MG tablet TK 1 T PO QD. REPLACES VALSARTAN 06/06/17   [provider]  Multiple Vitamin (MULTIVITAMIN) tablet Take 1 tablet by mouth daily.    [provider]  naproxen (NAPROSYN) 375 MG tablet Take 1 tablet twice daily for pleuritic chest pain. 01/11/21   Molpus, John, MD  Omega-3 Fatty Acids (FISH OIL) 500 MG CAPS Take 1 capsule by mouth daily.     [provider]  pravastatin (PRAVACHOL) 40 MG tablet TK 1 T PO QD 08/09/18   [provider]  triamcinolone (NASACORT) 55 MCG/ACT AERO nasal inhaler Place 2 sprays into the nose daily.    [provider]      Allergies    Amoxicillin and Egg-derived products  Review of Systems   Review of Systems A 10 point review of systems was performed and is negative unless otherwise reported in HPI.  Physical Exam Updated Vital Signs BP 107/65 (BP Location: Right Arm)   Pulse 89   Temp 98.1 F (36.7 C) (Oral)   Resp 18   Ht 5' 4.5" (1.638 m)   Wt 61.3 kg   SpO2 98%   BMI 22.84 kg/m  Physical Exam General: Normal appearing {Desc; female/female:11659}, lying in bed.  HEENT: PERRLA, Sclera anicteric, MMM, trachea midline.  Cardiology: RRR, no murmurs/rubs/gallops. BL radial and DP pulses equal bilaterally.  Resp: Normal respiratory rate and effort. CTAB, no wheezes, rhonchi, crackles.  Abd: Soft, non-tender, non-distended. No  rebound tenderness or guarding.  GU: Deferred. MSK: No peripheral edema or signs of trauma. Extremities without deformity or TTP. No cyanosis or clubbing. Skin: warm, dry. No rashes or lesions. Back: No CVA tenderness Neuro: A&Ox4, CNs II-XII grossly intact. MAEs. Sensation grossly intact.  Psych: Normal mood and affect.   1a  Level of consciousness: 0=alert; keenly responsive  1b. LOC questions:  0=Performs both tasks correctly  1c. LOC commands: 0=Performs both tasks correctly  2.  Best Gaze: {best gaze neuro:31402}  3.  Visual: {visual neuro:31403}  4. Facial Palsy: {facial palsy:31404}  5a.  Motor left arm: 0=No drift, limb holds 90 (or 45) degrees for full 10 seconds  5b.  Motor right arm: 0=No drift, limb holds 90 (or 45) degrees for full 10 seconds  6a. motor left leg: 0=No drift, limb holds 90 (or 45) degrees for full 10 seconds  6b  Motor right leg:  0=No drift, limb holds 90 (or 45) degrees for full 10 seconds  7. Limb Ataxia: 0=Absent  8.  Sensory: 0=Normal; no sensory loss  9. Best Language:  0=No aphasia, normal  10. Dysarthria: 0=Normal  11. Extinction and Inattention: 0=No abnormality   Total:   0        ED Results / Procedures / Treatments   Labs (all labs ordered are listed, but only abnormal results are displayed) Labs Reviewed  CBG MONITORING, ED - Abnormal; Notable for the following components:      Result Value   Glucose-Capillary 123 (*)    All other components within normal limits    EKG None  Radiology No results found.  Procedures Procedures  {Document cardiac monitor, telemetry assessment procedure when appropriate:1}  Medications Ordered in ED Medications - No data to display  ED Course/ Medical Decision Making/ A&P                          Medical Decision Making   This patient presents to the ED for concern of ***, this involves an extensive number of treatment options, and is a complaint that carries with it a high risk of  complications and morbidity.  I considered the following differential and admission for this acute, potentially life threatening condition.   MDM:    ***     Labs: I Ordered, and personally interpreted labs.  The pertinent results include:  those listed aobve  Imaging Studies ordered: I ordered imaging studies including CTH I independently visualized and interpreted imaging. I agree with the radiologist interpretation  Additional history obtained from son at bedside, chart review.    Cardiac Monitoring: The patient was maintained on a cardiac monitor.  I personally viewed and interpreted the cardiac monitored which showed an underlying rhythm of: NSR  Reevaluation: After the interventions noted above, I reevaluated the patient and found that they have :{resolved/improved/worsened:23923::"improved"}  Social Determinants of Health: Lives at facility  Disposition:  ***  Co morbidities that complicate the patient evaluation  Past Medical History:  Diagnosis Date   Allergy    Arthritis    osteoarthritis   Endometriosis    Fibromyalgia    Hypertension    NGB (new growth of bladder)    Noted at routine cystoscopy during prolapse surgery - April 2017.  Urology evaluationn benign and no biopsy needed.   Osteoporosis    Osteoporosis, unspecified 06/04/2014   PCP management with medication   PONV (postoperative nausea and vomiting)    Skin cancer    Tremor    benign tremor mainly in neck     Medicines No orders of the defined types were placed in this encounter.   I have reviewed the patients home medicines and have made adjustments as needed  Problem List / ED Course: Problem List Items Addressed This Visit   None        {Document critical care time when appropriate:1} {Document review of labs and clinical decision tools ie heart score, Chads2Vasc2 etc:1}  {Document your independent review of radiology images, and any outside records:1} {Document your  discussion with family members, caretakers, and with consultants:1} {Document social determinants of health affecting pt's care:1} {Document your decision making why or why not admission, treatments were needed:1}  This note was created using dictation software, which may contain spelling or grammatical errors.

## 2023-11-26 NOTE — ED Notes (Signed)
Up to b/r with steady

## 2023-11-26 NOTE — ED Triage Notes (Signed)
Pt currently lives in an assisted living and was found at 11 am on the floor. Pt has right sided weakness and right sided facial drooping. Last known well 830-11 am.

## 2023-11-26 NOTE — ED Notes (Signed)
Back to room from CT, Dr. Selina Cooley tele neuro in process. No bleeding.

## 2023-11-26 NOTE — ED Notes (Signed)
Alert, NAD, calm, interactive, resps e/u, following commands, oriented to baseline, follows commands. Cardiac rhythm irregular on monitor with P-waves. VSS

## 2023-11-26 NOTE — ED Notes (Signed)
Resting lightly. NAD, calm, family at [facility]. SLP into see.

## 2023-11-26 NOTE — H&P (Addendum)
History and Physical    [name]  GNF:[id number]  DOB: [birth year]  DOA: 11/26/2023 PCP: Rodrigo Ran, MD   Patient coming from: Assisted living  Chief Complaint: Found on the floor AM with right-sided weakness and right facial droop  HPI: [name] is a 78 y.o. female with medical history of this is a 78 year old female with dementia and hypertension who lives in assisted living facility.  Around 11:00 this morning, she was found on the floor.  We are not sure if she was responsive or not at that time.  Her son was called and was able to see her within 5 to 10 minutes.  He noted that she was more confused than usual.  Did say that she seemed to be leaning to the right.  He is not aware that she has been having any medical issues in the past week.  The patient is unable to give a history but states that she feels well.  ED Course: facial droop noted by ED and code stroke ordered CT head negative Potassium 3.4 AST 138 ALT 48 WBC count 12.9  Review of Systems:  All other systems reviewed and apart from HPI, are negative.  Past Medical History:  Diagnosis Date   Allergy    Arthritis    osteoarthritis   Endometriosis    Fibromyalgia    Hypertension    NGB (new growth of bladder)    Noted at routine cystoscopy during prolapse surgery - April 2017.  Urology evaluationn benign and no biopsy needed.   Osteoporosis    Osteoporosis, unspecified 06/04/2014   PCP management with medication   PONV (postoperative nausea and vomiting)    Skin cancer    Tremor    benign tremor mainly in neck    Past Surgical History:  Procedure Laterality Date   ANTERIOR AND POSTERIOR REPAIR N/A 02/17/2016   Procedure: ANTERIOR (CYSTOCELE) AND POSTERIOR REPAIR (RECTOCELE); Enterocele repair and lysis of adhesions;  Surgeon: Patton Salles, MD;  Location: [facility];  Service: Gynecology;  Laterality: N/A;   APPENDECTOMY     APPENDECTOMY     BLADDER SUSPENSION N/A 02/17/2016    Procedure: TRANSVAGINAL TAPE (TVT) PROCEDURE exact midurethral sling;  Surgeon: Patton Salles, MD;  Location: [facility];  Service: Gynecology;  Laterality: N/A;   CYSTO N/A 02/17/2016   Procedure: Clearnce Sorrel;  Surgeon: Patton Salles, MD;  Location: [facility];  Service: Gynecology;  Laterality: N/A;   STAPEDES SURGERY     left ear   TOTAL ABDOMINAL HYSTERECTOMY  1980's    Social History:   reports that she has never smoked. She has never used smokeless tobacco. She reports that she does not drink alcohol and does not use drugs.  Allergies  Allergen Reactions   Amoxicillin Rash   Egg-Derived Products Hives and Swelling    Egg whites cause swelling and hives. Pt states she can eat eggs cooked.    Family History  Problem Relation Age of Onset   Heart disease Father    Stroke Father    Hypertension Father    Alzheimer's disease Mother    Osteoporosis Mother    Heart disease Mother    Breast cancer Neg Hx      Prior to Admission medications   Medication Sig Start Date End Date Taking? Authorizing Provider  acetaminophen (TYLENOL) 325 MG tablet Take 650 mg by mouth every 6 (six) hours as needed for mild pain (pain score 1-3) or moderate  pain (pain score 4-6).   Yes [provider]  galantamine (RAZADYNE ER) 16 MG 24 hr capsule Take 16 mg by mouth every morning. 11/06/20  Yes [provider]  guaiFENesin (MUCINEX) 600 MG 12 hr tablet Take 600 mg by mouth 2 (two) times daily as needed for cough.   Yes [provider]  irbesartan (AVAPRO) 75 MG tablet Take 75 mg by mouth daily. 06/06/17  Yes [provider]  melatonin 3 MG TABS tablet Take 3 mg by mouth at bedtime.   Yes [provider]  Multiple Vitamin (MULTIVITAMIN) tablet Take 1 tablet by mouth daily.   Yes [provider]  pravastatin (PRAVACHOL) 40 MG tablet Take 40 mg by mouth daily. 08/09/18  Yes [provider]  sertraline (ZOLOFT) 50 MG tablet Take 50 mg by  mouth daily.   Yes [provider]    Physical Exam: Wt Readings from Last 3 Encounters:  11/26/23 61.3 kg  01/16/21 61.3 kg  01/10/21 59 kg   Vitals:   11/26/23 1320 11/26/23 1330 11/26/23 1340 11/26/23 1345  BP: 113/63 111/62 133/64 119/69  Pulse: 72 63 (!) 53   Resp: 12 (!) 21 17 (!) 22  Temp:      TempSrc:      SpO2: 97% 98% 98%   Weight:      Height:          Constitutional:  Calm & comfortable Eyes: PERRLA, lids and conjunctivae normal ENT:  Mucous membranes are moist.  Pharynx clear of exudate   Normal dentition.  Respiratory:  Clear to auscultation bilaterally  Normal respiratory effort.  Cardiovascular:  S1 & S2 heard, regular rate and rhythm No Murmurs Abdomen:  Non distended No tenderness, No masses Bowel sounds normal Extremities:  No clubbing / cyanosis She has mild pedal edema which apparently is chronic Skin:  No rashes, lesions or ulcers -Bruise on right elbow Neurologic:  oriented only to person CN 2-12 grossly intact Sensation intact Strength 5/5 in all 4 extremities Psychiatric:  Normal Mood and affect    Labs on Admission: I have personally reviewed following labs and imaging studies  CBC: Recent Labs  Lab 11/26/23 1251  WBC 12.9*  NEUTROABS 11.2*  HGB 13.2  HCT 39.7  MCV 95.2  PLT 220   Basic Metabolic Panel: Recent Labs  Lab 11/26/23 1251  NA 136  K 3.4*  CL 103  CO2 24  GLUCOSE 139*  BUN 17  CREATININE 0.55  CALCIUM 9.3   GFR: Estimated Creatinine Clearance: 51.1 mL/min (by C-G formula based on SCr of 0.55 mg/dL). Liver Function Tests: Recent Labs  Lab 11/26/23 1251  AST 138*  ALT 48*  ALKPHOS 81  BILITOT 1.5*  PROT 7.0  ALBUMIN 4.0   No results for input(s): "LIPASE", "AMYLASE" in the last 168 hours. No results for input(s): "AMMONIA" in the last 168 hours. Coagulation Profile: Recent Labs  Lab 11/26/23 1251  INR 1.1   Cardiac Enzymes: No results for input(s): "CKTOTAL", "CKMB",  "CKMBINDEX", "TROPONINI" in the last 168 hours. BNP (last 3 results) No results for input(s): "PROBNP" in the last 8760 hours. HbA1C: No results for input(s): "HGBA1C" in the last 72 hours. CBG: Recent Labs  Lab 11/26/23 1242  GLUCAP 123*   Lipid Profile: No results for input(s): "CHOL", "HDL", "LDLCALC", "TRIG", "CHOLHDL", "LDLDIRECT" in the last 72 hours. Thyroid Function Tests: No results for input(s): "TSH", "T4TOTAL", "FREET4", "T3FREE", "THYROIDAB" in the last 72 hours. Anemia Panel: No results  for input(s): "VITAMINB12", "FOLATE", "FERRITIN", "TIBC", "IRON", "RETICCTPCT" in the last 72 hours. Urine analysis:    Component Value Date/Time   COLORURINE YELLOW 09/27/2022 1406   APPEARANCEUR HAZY (A) 09/27/2022 1406   LABSPEC 1.005 09/27/2022 1406   PHURINE 7.0 09/27/2022 1406   GLUCOSEU NEGATIVE 09/27/2022 1406   HGBUR NEGATIVE 09/27/2022 1406   BILIRUBINUR NEGATIVE 09/27/2022 1406   BILIRUBINUR n 05/29/2019 0810   KETONESUR 5 (A) 09/27/2022 1406   PROTEINUR NEGATIVE 09/27/2022 1406   UROBILINOGEN 0.2 05/29/2019 0810   NITRITE NEGATIVE 09/27/2022 1406   LEUKOCYTESUR TRACE (A) 09/27/2022 1406   Sepsis Labs: @LABRCNTIP (procalcitonin:4,lacticidven:4) )No results found for this or any previous visit (from the past 240 hours).   Radiological Exams on Admission: CT HEAD CODE STROKE WO CONTRAST Result Date: 11/26/2023 CLINICAL DATA:  Code stroke. Transient ischemic attack. Neurological deficit, acute, stroke suspected. Found on the floor. EXAM: CT HEAD WITHOUT CONTRAST TECHNIQUE: Contiguous axial images were obtained from the base of the skull through the vertex without intravenous contrast. RADIATION DOSE REDUCTION: This exam was performed according to the departmental dose-optimization program which includes automated exposure control, adjustment of the mA and/or kV according to patient size and/or use of iterative reconstruction technique. COMPARISON:  09/27/2022 FINDINGS:  Brain: Age related volume loss without subjective lobar predominance. Chronic small-vessel ischemic changes of the white matter with central volume loss. No sign of acute infarction, mass lesion, hemorrhage, hydrocephalus or extra-axial collection. Vascular: There is atherosclerotic calcification of the major vessels at the base of the brain. Skull: Negative Sinuses/Orbits: Clear/normal Other: None ASPECTS (Alberta Stroke Program Early CT Score) - Ganglionic level infarction (caudate, lentiform nuclei, internal capsule, insula, M1-M3 cortex): 7 - Supraganglionic infarction (M4-M6 cortex): 3 Total score (0-10 with 10 being normal): 10 IMPRESSION: 1. No acute CT finding. Age related volume loss and chronic small-vessel ischemic changes of the white matter. 2. Aspects is 10. 3. These results were communicated to Dr. Selina Cooley at 1:11 pm on 11/26/2023 by text page via the [facility] messaging system. Electronically Signed   By: Paulina Fusi M.D.   On: 11/26/2023 13:11     Assessment/Plan Principal Problem:   Facial droop - on my exam, she has a mild left facial droop   - Stroke workup has been ordered including MRI, MRA, neurochecks, echo.  A1c and fasting lipids and aspirin and Plavix as recommended by neurology -Neurology is asked she be admitted to [facility], -Resume Pravachol -Per RN, she did not pass her speech eval and he is attempted to contact the speech therapist  Active Problems:  Fall versus syncope - Found on the floor of facility - Monitor on telemetry  Hypokalemia - Replace  Dementia - Resume galantamine    HTN (hypertension) -Hold  irbesartan in setting of possible CVA    DVT prophylaxis: lovenox  Code Status: DNR- discussed with son who is her POA  Consults called: Neurology  Admission status:  Level of care: Telemetry Medical  Calvert Cantor MD [facility]    11/26/2023, 3:01 PM

## 2023-11-27 ENCOUNTER — Observation Stay (HOSPITAL_BASED_OUTPATIENT_CLINIC_OR_DEPARTMENT_OTHER): Payer: Medicare Other

## 2023-11-27 ENCOUNTER — Observation Stay (HOSPITAL_COMMUNITY): Payer: Medicare Other

## 2023-11-27 DIAGNOSIS — R29810 Facial weakness: Secondary | ICD-10-CM | POA: Diagnosis present

## 2023-11-27 DIAGNOSIS — Z6822 Body mass index (BMI) 22.0-22.9, adult: Secondary | ICD-10-CM | POA: Diagnosis not present

## 2023-11-27 DIAGNOSIS — R55 Syncope and collapse: Secondary | ICD-10-CM | POA: Diagnosis not present

## 2023-11-27 DIAGNOSIS — G459 Transient cerebral ischemic attack, unspecified: Secondary | ICD-10-CM | POA: Diagnosis not present

## 2023-11-27 DIAGNOSIS — W19XXXA Unspecified fall, initial encounter: Secondary | ICD-10-CM | POA: Diagnosis not present

## 2023-11-27 DIAGNOSIS — I700 Atherosclerosis of aorta: Secondary | ICD-10-CM | POA: Diagnosis not present

## 2023-11-27 DIAGNOSIS — Z85828 Personal history of other malignant neoplasm of skin: Secondary | ICD-10-CM | POA: Diagnosis not present

## 2023-11-27 DIAGNOSIS — I639 Cerebral infarction, unspecified: Secondary | ICD-10-CM

## 2023-11-27 DIAGNOSIS — I10 Essential (primary) hypertension: Secondary | ICD-10-CM | POA: Diagnosis not present

## 2023-11-27 DIAGNOSIS — Z79899 Other long term (current) drug therapy: Secondary | ICD-10-CM | POA: Diagnosis not present

## 2023-11-27 DIAGNOSIS — F0390 Unspecified dementia without behavioral disturbance: Secondary | ICD-10-CM | POA: Diagnosis not present

## 2023-11-27 DIAGNOSIS — G3184 Mild cognitive impairment, so stated: Secondary | ICD-10-CM | POA: Diagnosis not present

## 2023-11-27 DIAGNOSIS — Z1389 Encounter for screening for other disorder: Secondary | ICD-10-CM | POA: Diagnosis not present

## 2023-11-27 DIAGNOSIS — R531 Weakness: Secondary | ICD-10-CM | POA: Diagnosis not present

## 2023-11-27 DIAGNOSIS — E876 Hypokalemia: Secondary | ICD-10-CM | POA: Diagnosis not present

## 2023-11-27 LAB — LIPID PANEL
Cholesterol: 129 mg/dL (ref 0–200)
HDL: 63 mg/dL (ref >40)
LDL Cholesterol: 53 mg/dL (ref 0–99)
Total CHOL/HDL Ratio: 2 {ratio}
Triglycerides: 63 mg/dL (ref <150)
VLDL: 13 mg/dL (ref 0–40)

## 2023-11-27 LAB — COMPREHENSIVE METABOLIC PANEL
ALT: 47 U/L — ABNORMAL HIGH (ref 0–44)
AST: 112 U/L — ABNORMAL HIGH (ref 15–41)
Albumin: 3 g/dL — ABNORMAL LOW (ref 3.5–5.0)
Alkaline Phosphatase: 67 U/L (ref 38–126)
Anion gap: 9 (ref 5–15)
BUN: 10 mg/dL (ref 8–23)
CO2: 23 mmol/L (ref 22–32)
Calcium: 8.4 mg/dL — ABNORMAL LOW (ref 8.9–10.3)
Chloride: 105 mmol/L (ref 98–111)
Creatinine, Ser: 0.63 mg/dL (ref 0.44–1.00)
GFR, Estimated: >60 mL/min (ref >60)
Glucose, Bld: 101 mg/dL — ABNORMAL HIGH (ref 70–99)
Potassium: 3.7 mmol/L (ref 3.5–5.1)
Sodium: 137 mmol/L (ref 135–145)
Total Bilirubin: 1 mg/dL (ref 0.0–1.2)
Total Protein: 5.7 g/dL — ABNORMAL LOW (ref 6.5–8.1)

## 2023-11-27 LAB — ECHOCARDIOGRAM COMPLETE
AR max vel: 2.55 cm2
AV Area VTI: 2.48 cm2
AV Area mean vel: 2.08 cm2
AV Mean grad: 3 mm[Hg]
AV Peak grad: 4.8 mm[Hg]
Ao pk vel: 1.1 m/s
Area-P 1/2: 2.71 cm2
Calc EF: 66.7 %
Height: 64.5 in
MV VTI: 2.77 cm2
S' Lateral: 2.8 cm
Single Plane A2C EF: 70.7 %
Single Plane A4C EF: 62 %
Weight: 2162.27 [oz_av]

## 2023-11-27 LAB — CBC
HCT: 34.3 % — ABNORMAL LOW (ref 36.0–46.0)
Hemoglobin: 11.5 g/dL — ABNORMAL LOW (ref 12.0–15.0)
MCH: 31.1 pg (ref 26.0–34.0)
MCHC: 33.5 g/dL (ref 30.0–36.0)
MCV: 92.7 fL (ref 80.0–100.0)
Platelets: 192 10*3/uL (ref 150–400)
RBC: 3.7 MIL/uL — ABNORMAL LOW (ref 3.87–5.11)
RDW: 13.5 % (ref 11.5–15.5)
WBC: 9.3 10*3/uL (ref 4.0–10.5)
nRBC: 0 % (ref 0.0–0.2)

## 2023-11-27 MED ORDER — IOHEXOL 350 MG/ML SOLN
75.0000 mL | Freq: Once | INTRAVENOUS | Status: AC | PRN
  Administered 2023-11-27: 75 mL via INTRAVENOUS

## 2023-11-27 MED ORDER — SODIUM CHLORIDE 0.9 % IV SOLN
1.0000 g | INTRAVENOUS | Status: DC
  Administered 2023-11-27 – 2023-11-28 (×2): 1 g via INTRAVENOUS
  Filled 2023-11-27 (×2): qty 10

## 2023-11-27 NOTE — ED Notes (Signed)
.ED TO INPATIENT HANDOFF REPORT  ED Nurse Name and Phone #: Burnadette Pop, RN [phone]  S Name/Age/Gender [name] 78 y.o. female Room/Bed: WA18/WA18  Code Status   Code Status: Limited: Do not attempt resuscitation (DNR) -DNR-LIMITED -Do Not Intubate/DNI   Home/SNF/Other Skilled nursing facility Patient oriented to: self Is this baseline? Yes   Triage Complete: Triage complete  Chief Complaint Stroke [facility]) [I63.9]  Triage Note Pt currently lives in an assisted living and was found at 11 am on the floor. Pt has right sided weakness and right sided facial drooping. Last known well 830-11 am.    Allergies Allergies  Allergen Reactions   Amoxicillin Rash   Egg-Derived Products Hives and Swelling    Egg whites cause swelling and hives. Pt states she can eat eggs cooked.    Level of Care/Admitting Diagnosis ED Disposition     ED Disposition  Admit   Condition  --   Comment  Hospital Area: [facility] [[id number]]  Level of Care: Telemetry Medical [[id number]]  May place patient in observation at [facility] or [facility] if equivalent level of care is available:: No  Covid Evaluation: Asymptomatic - no recent exposure (last 10 days) testing not required  Diagnosis: Stroke [facility]) [[id number]]  Admitting Physician: Calvert Cantor [[id number]]  Attending Physician: Calvert Cantor [[id number]]          B Medical/Surgery History Past Medical History:  Diagnosis Date   Allergy    Arthritis    osteoarthritis   Endometriosis    Fibromyalgia    Hypertension    NGB (new growth of bladder)    Noted at routine cystoscopy during prolapse surgery - April 2017.  Urology evaluationn benign and no biopsy needed.   Osteoporosis    Osteoporosis, unspecified 06/04/2014   PCP management with medication   PONV (postoperative nausea and vomiting)    Skin cancer    Tremor    benign tremor mainly in neck   Past Surgical History:  Procedure Laterality Date   ANTERIOR AND  POSTERIOR REPAIR N/A 02/17/2016   Procedure: ANTERIOR (CYSTOCELE) AND POSTERIOR REPAIR (RECTOCELE); Enterocele repair and lysis of adhesions;  Surgeon: Patton Salles, MD;  Location: [facility];  Service: Gynecology;  Laterality: N/A;   APPENDECTOMY     APPENDECTOMY     BLADDER SUSPENSION N/A 02/17/2016   Procedure: TRANSVAGINAL TAPE (TVT) PROCEDURE exact midurethral sling;  Surgeon: Patton Salles, MD;  Location: [facility];  Service: Gynecology;  Laterality: N/A;   CYSTO N/A 02/17/2016   Procedure: Clearnce Sorrel;  Surgeon: Patton Salles, MD;  Location: [facility];  Service: Gynecology;  Laterality: N/A;   STAPEDES SURGERY     left ear   TOTAL ABDOMINAL HYSTERECTOMY  1980's     A IV Location/Drains/Wounds Patient Lines/Drains/Airways Status     Active Line/Drains/Airways     Name Placement date Placement time Site Days   Peripheral IV 11/26/23 20 G Left Forearm 11/26/23  1240  Forearm  1            Intake/Output Last 24 hours  Intake/Output Summary (Last 24 hours) at 11/27/2023 0454 Last data filed at 11/26/2023 1651 Gross per 24 hour  Intake 480 ml  Output --  Net 480 ml    Labs/Imaging Results for orders placed or performed during the hospital encounter of 11/26/23 (from the past 48 hours)  Hemoglobin A1c     Status: Abnormal   Collection Time: 11/26/23 12:41  PM  Result Value Ref Range   Hgb A1c MFr Bld 5.7 (H) 4.8 - 5.6 %    Comment: (NOTE) Pre diabetes:          5.7%-6.4%  Diabetes:              >6.4%  Glycemic control for   <7.0% adults with diabetes    Mean Plasma Glucose 116.89 mg/dL    Comment: Performed at [facility], [address] [place], [place] [address]  CBG monitoring, ED     Status: Abnormal   Collection Time: 11/26/23 12:42 PM  Result Value Ref Range   Glucose-Capillary 123 (H) 70 - 99 mg/dL    Comment: Glucose reference range applies only to samples taken after fasting for at least 8 hours.  Ethanol     Status: None    Collection Time: 11/26/23 12:51 PM  Result Value Ref Range   Alcohol, Ethyl (B) <10 <10 mg/dL    Comment: (NOTE) Lowest detectable limit for serum alcohol is 10 mg/dL.  For medical purposes only. Performed at [facility], [address] [place], [place] [address]   Protime-INR     Status: None   Collection Time: 11/26/23 12:51 PM  Result Value Ref Range   Prothrombin Time 14.0 11.4 - 15.2 seconds   INR 1.1 0.8 - 1.2    Comment: (NOTE) INR goal varies based on device and disease states. Performed at [facility], [address], [place], [place] [address]   APTT     Status: None   Collection Time: 11/26/23 12:51 PM  Result Value Ref Range   aPTT 29 24 - 36 seconds    Comment: Performed at [facility], [address] [place], [place] [address]  CBC     Status: Abnormal   Collection Time: 11/26/23 12:51 PM  Result Value Ref Range   WBC 12.9 (H) 4.0 - 10.5 K/uL   RBC 4.17 3.87 - 5.11 MIL/uL   Hemoglobin 13.2 12.0 - 15.0 g/dL   HCT 29.5 62.1 - 30.8 %   MCV 95.2 80.0 - 100.0 fL   MCH 31.7 26.0 - 34.0 pg   MCHC 33.2 30.0 - 36.0 g/dL   RDW 65.7 84.6 - 96.2 %   Platelets 220 150 - 400 K/uL   nRBC 0.0 0.0 - 0.2 %    Comment: Performed at [facility], [address], [place], [place] [address]  Differential     Status: Abnormal   Collection Time: 11/26/23 12:51 PM  Result Value Ref Range   Neutrophils Relative % 87 %   Neutro Abs 11.2 (H) 1.7 - 7.7 K/uL   Lymphocytes Relative 7 %   Lymphs Abs 0.9 0.7 - 4.0 K/uL   Monocytes Relative 6 %   Monocytes Absolute 0.8 0.1 - 1.0 K/uL   Eosinophils Relative 0 %   Eosinophils Absolute 0.0 0.0 - 0.5 K/uL   Basophils Relative 0 %   Basophils Absolute 0.0 0.0 - 0.1 K/uL   Immature Granulocytes 0 %   Abs Immature Granulocytes 0.05 0.00 - 0.07 K/uL    Comment: Performed at [facility], [address], [place], [place] [address]   Comprehensive metabolic panel     Status: Abnormal   Collection Time: 11/26/23 12:51 PM  Result Value Ref Range   Sodium 136 135 - 145 mmol/L   Potassium 3.4 (L) 3.5 - 5.1 mmol/L   Chloride 103 98 -  111 mmol/L   CO2 24 22 - 32 mmol/L   Glucose, Bld 139 (H) 70 - 99 mg/dL    Comment: Glucose reference range applies only to samples taken after fasting for at least 8 hours.   BUN 17 8 - 23 mg/dL   Creatinine, Ser 7.82 0.44 - 1.00 mg/dL   Calcium 9.3 8.9 - 95.6 mg/dL   Total Protein 7.0 6.5 - 8.1 g/dL   Albumin 4.0 3.5 - 5.0 g/dL   AST 213 (H) 15 - 41 U/L   ALT 48 (H) 0 - 44 U/L   Alkaline Phosphatase 81 38 - 126 U/L   Total Bilirubin 1.5 (H) 0.0 - 1.2 mg/dL   GFR, Estimated >08 >65 mL/min    Comment: (NOTE) Calculated using the CKD-EPI Creatinine Equation (2021)    Anion gap 9 5 - 15    Comment: Performed at [facility], [address], [place], [place] [address]  Urine rapid drug screen (hosp performed)     Status: None   Collection Time: 11/26/23  9:50 PM  Result Value Ref Range   Opiates NONE DETECTED NONE DETECTED   Cocaine NONE DETECTED NONE DETECTED   Benzodiazepines NONE DETECTED NONE DETECTED   Amphetamines NONE DETECTED NONE DETECTED   Tetrahydrocannabinol NONE DETECTED NONE DETECTED   Barbiturates NONE DETECTED NONE DETECTED    Comment: (NOTE) DRUG SCREEN FOR MEDICAL PURPOSES ONLY.  IF CONFIRMATION IS NEEDED FOR ANY PURPOSE, NOTIFY LAB WITHIN 5 DAYS.  LOWEST DETECTABLE LIMITS FOR URINE DRUG SCREEN Drug Class                     Cutoff (ng/mL) Amphetamine and metabolites    1000 Barbiturate and metabolites    200 Benzodiazepine                 200 Opiates and metabolites        300 Cocaine and metabolites        300 THC                            50 Performed at [facility], [address] [place], [place] [address]   Urinalysis, Routine w reflex microscopic -Urine, Clean Catch     Status: Abnormal   Collection Time:  11/26/23  9:50 PM  Result Value Ref Range   Color, Urine YELLOW YELLOW   APPearance HAZY (A) CLEAR   Specific Gravity, Urine 1.005 1.005 - 1.030   pH 6.0 5.0 - 8.0   Glucose, UA NEGATIVE NEGATIVE mg/dL   Hgb urine dipstick SMALL (A) NEGATIVE   Bilirubin Urine NEGATIVE NEGATIVE   Ketones, ur NEGATIVE NEGATIVE mg/dL   Protein, ur NEGATIVE NEGATIVE mg/dL   Nitrite POSITIVE (A) NEGATIVE   Leukocytes,Ua SMALL (A) NEGATIVE   RBC / HPF 0-5 0 - 5 RBC/hpf   WBC, UA 11-20 0 - 5 WBC/hpf   Bacteria, UA MANY (A) NONE SEEN   Squamous Epithelial / HPF 0-5 0 - 5 /HPF    Comment: Performed at [facility], [address] [place], [place] [address]   CT HEAD CODE STROKE WO CONTRAST Result Date: 11/26/2023 CLINICAL DATA:  Code stroke. Transient ischemic attack. Neurological deficit, acute, stroke suspected. Found on the floor. EXAM: CT HEAD WITHOUT CONTRAST TECHNIQUE: Contiguous axial images were obtained from the base of the skull through the vertex without intravenous contrast. RADIATION DOSE REDUCTION: This exam was  performed according to the departmental dose-optimization program which includes automated exposure control, adjustment of the mA and/or kV according to patient size and/or use of iterative reconstruction technique. COMPARISON:  09/27/2022 FINDINGS: Brain: Age related volume loss without subjective lobar predominance. Chronic small-vessel ischemic changes of the white matter with central volume loss. No sign of acute infarction, mass lesion, hemorrhage, hydrocephalus or extra-axial collection. Vascular: There is atherosclerotic calcification of the major vessels at the base of the brain. Skull: Negative Sinuses/Orbits: Clear/normal Other: None ASPECTS (Alberta Stroke Program Early CT Score) - Ganglionic level infarction (caudate, lentiform nuclei, internal capsule, insula, M1-M3 cortex): 7 - Supraganglionic infarction (M4-M6 cortex): 3 Total score (0-10 with 10 being normal): 10  IMPRESSION: 1. No acute CT finding. Age related volume loss and chronic small-vessel ischemic changes of the white matter. 2. Aspects is 10. 3. These results were communicated to Dr. Selina Cooley at 1:11 pm on 11/26/2023 by text page via the [facility] messaging system. Electronically Signed   By: Paulina Fusi M.D.   On: 11/26/2023 13:11    Pending Labs Unresulted Labs (From admission, onward)     Start     Ordered   12/03/23 0500  Creatinine, serum  (enoxaparin (LOVENOX)    CrCl >/= 30 ml/min)  Weekly,   R     Comments: while on enoxaparin therapy    11/26/23 1433   11/27/23 0500  Lipid panel  (Labs)  Tomorrow morning,   R       Comments: Fasting    11/26/23 1433   11/27/23 0500  Comprehensive metabolic panel  (Labs)  Tomorrow morning,   R        11/26/23 1433   11/27/23 0500  CBC  (Labs)  Tomorrow morning,   R        11/26/23 1433   11/26/23 1430  CBC  (enoxaparin (LOVENOX)    CrCl >/= 30 ml/min)  Once,   R       Comments: Baseline for enoxaparin therapy IF NOT ALREADY DRAWN.  Notify MD if PLT < 100 K.    11/26/23 1433   11/26/23 1430  Creatinine, serum  (enoxaparin (LOVENOX)    CrCl >/= 30 ml/min)  Once,   R       Comments: Baseline for enoxaparin therapy IF NOT ALREADY DRAWN.    11/26/23 1433            Vitals/Pain Today's Vitals   11/27/23 0300 11/27/23 0330 11/27/23 0430 11/27/23 0440  BP: (!) 134/57 131/70 119/72   Pulse: 83 81 87   Resp: (!) 27 (!) 22 (!) 28   Temp:    98.2 F (36.8 C)  TempSrc:    Oral  SpO2: 94% 92% 91%   Weight:      Height:      PainSc:        Isolation Precautions No active isolations  Medications Medications   stroke: early stages of recovery book (has no administration in time range)  acetaminophen (TYLENOL) tablet 650 mg (has no administration in time range)    Or  acetaminophen (TYLENOL) 160 MG/5ML solution 650 mg (has no administration in time range)    Or  acetaminophen (TYLENOL) suppository 650 mg (has no administration in time range)   enoxaparin (LOVENOX) injection 40 mg (40 mg Subcutaneous Given 11/26/23 1815)  senna-docusate (Senokot-S) tablet 1 tablet (has no administration in time range)  clopidogrel (PLAVIX) tablet 75 mg (75 mg Oral Given 11/26/23 1650)  aspirin EC tablet 81  mg (81 mg Oral Given 11/26/23 1650)  galantamine (RAZADYNE ER) 24 hr capsule 16 mg (has no administration in time range)  pravastatin (PRAVACHOL) tablet 40 mg (40 mg Oral Given 11/26/23 1815)  sertraline (ZOLOFT) tablet 50 mg (has no administration in time range)  potassium chloride SA (KLOR-CON M) CR tablet 40 mEq (40 mEq Oral Given 11/26/23 1651)    Mobility walks with person assist     Focused Assessments Neuro Assessment Handoff:  Swallow screen pass? Yes  Cardiac Rhythm:  (HR 78, p-waves present) NIH Stroke Scale  Dizziness Present: No Headache Present: No Interval:  (q4 hour assessment) Level of Consciousness (1a.)   : Alert, keenly responsive LOC Questions (1b. )   : Answers one question correctly LOC Commands (1c. )   : Performs one task correctly Best Gaze (2. )  : Normal Visual (3. )  : No visual loss Facial Palsy (4. )    : Normal symmetrical movements Motor Arm, Left (5a. )   : No drift Motor Arm, Right (5b. ) : No drift Motor Leg, Left (6a. )  : No drift Motor Leg, Right (6b. ) : No drift Limb Ataxia (7. ): Absent Sensory (8. )  : Normal, no sensory loss Best Language (9. )  : No aphasia Dysarthria (10. ): Mild-to-moderate dysarthria, patient slurs at least some words and, at worst, can be understood with some difficulty Extinction/Inattention (11.)   : No Abnormality Complete NIHSS TOTAL: 3 Last date known well: 11/26/23 Last time known well: 0830 Neuro Assessment: Exceptions to [facility] Neuro Checks:   Other (Comment) (11/26/23 2037)  Has TPA been given? No If patient is a Neuro Trauma and patient is going to OR before floor call report to 4N Charge nurse: [phone] or [phone]   R Recommendations: See  Admitting Provider Note  Report given to:   Additional Notes: pt is alert to self, place, but not situation or time. Pt has essential tremors at baseline, but is able to stand up with assistance and use the commode. Pt is breathing easy with equal chest rise and in no acute distress. PT on cardiac monitor and VSS.

## 2023-11-27 NOTE — Procedures (Signed)
Routine EEG Report  [name] is a 78 y.o. female with a history of altered mental status who is undergoing an EEG to evaluate for seizures.  Report: This EEG was acquired with electrodes placed according to the International 10-20 electrode system (including Fp1, Fp2, F3, F4, C3, C4, P3, P4, O1, O2, T3, T4, T5, T6, A1, A2, Fz, Cz, Pz). The following electrodes were missing or displaced: none.  The occipital dominant rhythm was 4-6 Hz. This activity is reactive to stimulation. Drowsiness was manifested by background fragmentation; deeper stages of sleep were not identified. There was no focal slowing. There were no interictal epileptiform discharges. There were no electrographic seizures identified. Photic stimulation and hyperventilation were not performed.  Impression and clinical correlation: This EEG was obtained while awake and drowsy and is abnormal due to moderate-to-severe diffuse slowing indicative of global cerebral dysfunction. Epileptiform abnormalities were not seen during this recording.  Bing Neighbors, MD [facility] [phone]  If 7pm- 7am, please page neurology on call as listed in AMION.

## 2023-11-27 NOTE — Progress Notes (Signed)
OT Cancellation Note  Patient Details Name: [name] MRN: [id number] DOB: 05-Dec-1945   Cancelled Treatment:    Reason Eval/Treat Not Completed: Other (comment) Transport has been called for patient to transition to [facility]. OT to continue to follow and check back after patient has transitioned.  Rosalio Loud, MS Acute Rehabilitation Department Office# [phone]  11/27/2023, 6:51 AM

## 2023-11-27 NOTE — Progress Notes (Signed)
  Echocardiogram 2D Echocardiogram has been performed.  Ocie Doyne RDCS 11/27/2023, 11:29 AM

## 2023-11-27 NOTE — Progress Notes (Signed)
PROGRESS NOTE        PATIENT DETAILS Name: [name] Age: 78 y.o. Sex: female Date of Birth: [birth year] Admit Date: 11/26/2023 Admitting Physician Calvert Cantor, MD ZOX:[id number], Loraine Leriche, MD  Brief Summary: Patient is a 78 y.o.  female with history of HTN, dementia-who lives in assisted living-found on the floor with left facial droop  Significant events: 1/18>> admit to [facility]  Significant studies: 1/18>> CT head: No acute findings. 1/18>> A1c: 5.7 1/19>> LDL: 53  Significant microbiology data: None  Procedures: None  Consults: Neurology  Subjective: Frail-moves all 4 extremities-minimally confused.  Objective: Vitals: Blood pressure 119/72, pulse 87, temperature 98.2 F (36.8 C), temperature source Oral, resp. rate (!) 28, height 5' 4.5" (1.638 m), weight 61.3 kg, SpO2 91%.   Exam: Gen Exam:Alert awake-not in any distress HEENT:atraumatic, normocephalic Chest: B/L clear to auscultation anteriorly CVS:S1S2 regular Abdomen:soft non tender, non distended Extremities:no edema Neurology: Question slight left facial droop-moving all 4 extremities-difficult exam. Skin: no rash  Pertinent Labs/Radiology:    Latest Ref Rng & Units 11/27/2023    9:13 AM 11/26/2023   12:51 PM 09/27/2022   12:10 PM  CBC  WBC 4.0 - 10.5 K/uL 9.3  12.9  10.1   Hemoglobin 12.0 - 15.0 g/dL 40.9  81.1  91.4   Hematocrit 36.0 - 46.0 % 34.3  39.7  41.2   Platelets 150 - 400 K/uL 192  220  199     Lab Results  Component Value Date   NA 137 11/27/2023   K 3.7 11/27/2023   CL 105 11/27/2023   CO2 23 11/27/2023      Assessment/Plan: TIA versus CVA Minimal left facial droop Difficult exam but moving all 4 extremities Awaiting MRI brain/MRA/echo and formal neurology evaluation In the interim-continue aspirin/Plavix Mobilize with PT/OT  Fall At ALF PT/OT  HTN BP stable All antihypertensives on hold-allowing permissive hyperglycemia  Dementia continue  galantamine Delirium precautions  BMI: Estimated body mass index is 22.84 kg/m as calculated from the following:   Height as of this encounter: 5' 4.5" (1.638 m).   Weight as of this encounter: 61.3 kg.   Code status:   Code Status: Limited: Do not attempt resuscitation (DNR) -DNR-LIMITED -Do Not Intubate/DNI    DVT Prophylaxis: enoxaparin (LOVENOX) injection 40 mg Start: 11/26/23 1800   Family Communication: None at bedside   Disposition Plan: Status is: Observation The patient remains OBS appropriate and will d/c before 2 midnights.   Planned Discharge Destination:Assisted living   Diet: Diet Order             Diet regular Room service appropriate? No; Fluid consistency: Thin  Diet effective now                     Antimicrobial agents: Anti-infectives (From admission, onward)    None        MEDICATIONS: Scheduled Meds:   stroke: early stages of recovery book   Does not apply Once   aspirin EC  81 mg Oral Daily   clopidogrel  75 mg Oral Daily   enoxaparin (LOVENOX) injection  40 mg Subcutaneous Q24H   galantamine  16 mg Oral q morning   pravastatin  40 mg Oral q1800   sertraline  50 mg Oral Daily   Continuous Infusions: PRN Meds:.acetaminophen **OR** acetaminophen (TYLENOL) oral liquid 160 mg/5 mL **  OR** acetaminophen, senna-docusate   I have personally reviewed following labs and imaging studies  LABORATORY DATA: CBC: Recent Labs  Lab 11/26/23 1251 11/27/23 0913  WBC 12.9* 9.3  NEUTROABS 11.2*  --   HGB 13.2 11.5*  HCT 39.7 34.3*  MCV 95.2 92.7  PLT 220 192    Basic Metabolic Panel: Recent Labs  Lab 11/26/23 1251 11/27/23 0913  NA 136 137  K 3.4* 3.7  CL 103 105  CO2 24 23  GLUCOSE 139* 101*  BUN 17 10  CREATININE 0.55 0.63  CALCIUM 9.3 8.4*    GFR: Estimated Creatinine Clearance: 51.1 mL/min (by C-G formula based on SCr of 0.63 mg/dL).  Liver Function Tests: Recent Labs  Lab 11/26/23 1251 11/27/23 0913  AST 138*  112*  ALT 48* 47*  ALKPHOS 81 67  BILITOT 1.5* 1.0  PROT 7.0 5.7*  ALBUMIN 4.0 3.0*   No results for input(s): "LIPASE", "AMYLASE" in the last 168 hours. No results for input(s): "AMMONIA" in the last 168 hours.  Coagulation Profile: Recent Labs  Lab 11/26/23 1251  INR 1.1    Cardiac Enzymes: No results for input(s): "CKTOTAL", "CKMB", "CKMBINDEX", "TROPONINI" in the last 168 hours.  BNP (last 3 results) No results for input(s): "PROBNP" in the last 8760 hours.  Lipid Profile: Recent Labs    11/27/23 0913  CHOL 129  HDL 63  LDLCALC 53  TRIG 63  CHOLHDL 2.0    Thyroid Function Tests: No results for input(s): "TSH", "T4TOTAL", "FREET4", "T3FREE", "THYROIDAB" in the last 72 hours.  Anemia Panel: No results for input(s): "VITAMINB12", "FOLATE", "FERRITIN", "TIBC", "IRON", "RETICCTPCT" in the last 72 hours.  Urine analysis:    Component Value Date/Time   COLORURINE YELLOW 11/26/2023 2150   APPEARANCEUR HAZY (A) 11/26/2023 2150   LABSPEC 1.005 11/26/2023 2150   PHURINE 6.0 11/26/2023 2150   GLUCOSEU NEGATIVE 11/26/2023 2150   HGBUR SMALL (A) 11/26/2023 2150   BILIRUBINUR NEGATIVE 11/26/2023 2150   BILIRUBINUR n 05/29/2019 0810   KETONESUR NEGATIVE 11/26/2023 2150   PROTEINUR NEGATIVE 11/26/2023 2150   UROBILINOGEN 0.2 05/29/2019 0810   NITRITE POSITIVE (A) 11/26/2023 2150   LEUKOCYTESUR SMALL (A) 11/26/2023 2150    Sepsis Labs: Lactic Acid, Venous No results found for: "LATICACIDVEN"  MICROBIOLOGY: No results found for this or any previous visit (from the past 240 hours).  RADIOLOGY STUDIES/RESULTS: CT HEAD CODE STROKE WO CONTRAST Result Date: 11/26/2023 CLINICAL DATA:  Code stroke. Transient ischemic attack. Neurological deficit, acute, stroke suspected. Found on the floor. EXAM: CT HEAD WITHOUT CONTRAST TECHNIQUE: Contiguous axial images were obtained from the base of the skull through the vertex without intravenous contrast. RADIATION DOSE REDUCTION:  This exam was performed according to the departmental dose-optimization program which includes automated exposure control, adjustment of the mA and/or kV according to patient size and/or use of iterative reconstruction technique. COMPARISON:  09/27/2022 FINDINGS: Brain: Age related volume loss without subjective lobar predominance. Chronic small-vessel ischemic changes of the white matter with central volume loss. No sign of acute infarction, mass lesion, hemorrhage, hydrocephalus or extra-axial collection. Vascular: There is atherosclerotic calcification of the major vessels at the base of the brain. Skull: Negative Sinuses/Orbits: Clear/normal Other: None ASPECTS (Alberta Stroke Program Early CT Score) - Ganglionic level infarction (caudate, lentiform nuclei, internal capsule, insula, M1-M3 cortex): 7 - Supraganglionic infarction (M4-M6 cortex): 3 Total score (0-10 with 10 being normal): 10 IMPRESSION: 1. No acute CT finding. Age related volume loss and chronic small-vessel ischemic changes of the  white matter. 2. Aspects is 10. 3. These results were communicated to Dr. Selina Cooley at 1:11 pm on 11/26/2023 by text page via the [facility] messaging system. Electronically Signed   By: Paulina Fusi M.D.   On: 11/26/2023 13:11     LOS: 0 days   Jeoffrey Massed, MD  [facility]    To contact the attending provider between 7A-7P or the covering provider during after hours 7P-7A, please log into the web site [email or web address] and access using universal [place] password for that web site. If you do not have the password, please call the hospital operator.  11/27/2023, 10:13 AM

## 2023-11-27 NOTE — Progress Notes (Addendum)
STROKE TEAM PROGRESS NOTE   BRIEF HPI Ms. [name] is a 78 y.o. female with history of hypertension, osteoporosis, skin cancer, dementia and essential tremor presenting with acute onset right-sided weakness and right-sided facial droop.  TNK was not administered as symptoms were too mild to treat.  MRI was negative for acute infarct but did demonstrate slight enlargement of lateral and third ventricles, likely due to atrophy.  Urinalysis was positive for UTI.  NIH on Admission 4  INTERIM HISTORY/SUBJECTIVE Patient has remained hemodynamically stable and afebrile.  Family at bedside states that her mental status is at baseline.  OBJECTIVE  CBC    Component Value Date/Time   WBC 9.3 11/27/2023 0913   RBC 3.70 (L) 11/27/2023 0913   HGB 11.5 (L) 11/27/2023 0913   HCT 34.3 (L) 11/27/2023 0913   PLT 192 11/27/2023 0913   MCV 92.7 11/27/2023 0913   MCH 31.1 11/27/2023 0913   MCHC 33.5 11/27/2023 0913   RDW 13.5 11/27/2023 0913   LYMPHSABS 0.9 11/26/2023 1251   MONOABS 0.8 11/26/2023 1251   EOSABS 0.0 11/26/2023 1251   BASOSABS 0.0 11/26/2023 1251    BMET    Component Value Date/Time   NA 137 11/27/2023 0913   K 3.7 11/27/2023 0913   CL 105 11/27/2023 0913   CO2 23 11/27/2023 0913   GLUCOSE 101 (H) 11/27/2023 0913   BUN 10 11/27/2023 0913   CREATININE 0.63 11/27/2023 0913   CALCIUM 8.4 (L) 11/27/2023 0913   GFRNONAA >60 11/27/2023 0913    IMAGING past 24 hours MR BRAIN WO CONTRAST Result Date: 11/27/2023 CLINICAL DATA:  Stroke.  Follow-up. EXAM: MRI HEAD WITHOUT CONTRAST TECHNIQUE: Multiplanar, multiecho pulse sequences of the brain and surrounding structures were obtained without intravenous contrast. COMPARISON:  CT yesterday. FINDINGS: Brain: Diffusion imaging does not show any acute or subacute infarction or other cause of restricted diffusion. No focal abnormality affects the brainstem or cerebellum. Cerebral hemispheres show generalized atrophy without subjective  lobar predominance. There are minimal chronic small-vessel ischemic changes of the white matter. Slight enlargement of the lateral and third ventricles since the study of 2021. This could be due to progressive brain atrophy, but the possibility of normal pressure hydrocephalus does exist. Vascular: Major vessels at the base of the brain show flow. Skull and upper cervical spine: Negative Sinuses/Orbits: Clear/normal Other: None IMPRESSION: 1. No acute finding by MRI. Generalized atrophy without subjective lobar predominance. Minimal chronic small-vessel ischemic changes of the white matter. 2. Slight enlargement of the lateral and third ventricles since the study of 2021. This could be due to progressive brain atrophy, but the possibility of normal pressure hydrocephalus does exist. Electronically Signed   By: Paulina Fusi M.D.   On: 11/27/2023 13:57   ECHOCARDIOGRAM COMPLETE Result Date: 11/27/2023    ECHOCARDIOGRAM REPORT   Patient Name:   [name] Date of Exam: 11/27/2023 Medical Rec #:  [id number]       Height:       64.5 in Accession #:    [id number]      Weight:       135.1 lb Date of Birth:  [birth year]      BSA:          1.665 m Patient Age:    77 years        BP:           122/63 mmHg Patient Gender: F  HR:           88 bpm. Exam Location:  Inpatient Procedure: 2D Echo, Cardiac Doppler and Color Doppler Indications:    Stroke  History:        Patient has prior history of Echocardiogram examinations, most                 recent 02/11/2021. Signs/Symptoms:Chest Pain; Risk                 Factors:Hypertension and Dyslipidemia.  Sonographer:    Vern Claude Referring Phys: Calvert Cantor IMPRESSIONS  1. Left ventricular ejection fraction, by estimation, is 65 to 70%. The left ventricle has normal function. The left ventricle has no regional wall motion abnormalities. Left ventricular diastolic parameters were normal.  2. Right ventricular systolic function is normal. The right ventricular size  is normal. There is normal pulmonary artery systolic pressure.  3. The mitral valve is normal in structure. No evidence of mitral valve regurgitation. No evidence of mitral stenosis.  4. The aortic valve is tricuspid. There is mild thickening of the aortic valve. Aortic valve regurgitation is mild. No aortic stenosis is present. Comparison(s): Prior images reviewed side by side. Left ventricular function is more vigorous. FINDINGS  Left Ventricle: Left ventricular ejection fraction, by estimation, is 65 to 70%. The left ventricle has normal function. The left ventricle has no regional wall motion abnormalities. The left ventricular internal cavity size was normal in size. There is  no left ventricular hypertrophy. Left ventricular diastolic parameters were normal. Right Ventricle: The right ventricular size is normal. No increase in right ventricular wall thickness. Right ventricular systolic function is normal. There is normal pulmonary artery systolic pressure. The tricuspid regurgitant velocity is 2.73 m/s, and  with an assumed right atrial pressure of 3 mmHg, the estimated right ventricular systolic pressure is 32.8 mmHg. Left Atrium: Left atrial size was normal in size. Right Atrium: Right atrial size was normal in size. Pericardium: There is no evidence of pericardial effusion. Mitral Valve: The mitral valve is normal in structure. No evidence of mitral valve regurgitation. No evidence of mitral valve stenosis. MV peak gradient, 2.1 mmHg. The mean mitral valve gradient is 1.0 mmHg. Tricuspid Valve: The tricuspid valve is normal in structure. Tricuspid valve regurgitation is not demonstrated. No evidence of tricuspid stenosis. Aortic Valve: The aortic valve is tricuspid. There is mild thickening of the aortic valve. Aortic valve regurgitation is mild. No aortic stenosis is present. Aortic valve mean gradient measures 3.0 mmHg. Aortic valve peak gradient measures 4.8 mmHg. Aortic valve area, by VTI measures 2.48  cm. Pulmonic Valve: The pulmonic valve was normal in structure. Pulmonic valve regurgitation is mild. No evidence of pulmonic stenosis. Aorta: The aortic root and ascending aorta are structurally normal, with no evidence of dilitation. IAS/Shunts: The atrial septum is grossly normal.  LEFT VENTRICLE PLAX 2D LVIDd:         4.30 cm      Diastology LVIDs:         2.80 cm      LV e' medial:    9.46 cm/s LV PW:         1.00 cm      LV E/e' medial:  5.9 LV IVS:        0.70 cm      LV e' lateral:   9.25 cm/s LVOT diam:     1.90 cm      LV E/e' lateral: 6.0 LV SV:  59 LV SV Index:   36 LVOT Area:     2.84 cm  LV Volumes (MOD) LV vol d, MOD A2C: 111.0 ml LV vol d, MOD A4C: 108.0 ml LV vol s, MOD A2C: 32.5 ml LV vol s, MOD A4C: 41.0 ml LV SV MOD A2C:     78.5 ml LV SV MOD A4C:     108.0 ml LV SV MOD BP:      73.3 ml RIGHT VENTRICLE             IVC RV Basal diam:  3.90 cm     IVC diam: 1.90 cm RV Mid diam:    2.50 cm RV S prime:     14.90 cm/s TAPSE (M-mode): 2.6 cm LEFT ATRIUM             Index        RIGHT ATRIUM           Index LA diam:        2.40 cm 1.44 cm/m   RA Area:     14.50 cm LA Vol (A2C):   68.5 ml 41.14 ml/m  RA Volume:   35.80 ml  21.50 ml/m LA Vol (A4C):   36.1 ml 21.68 ml/m LA Biplane Vol: 53.1 ml 31.89 ml/m  AORTIC VALVE                    PULMONIC VALVE AV Area (Vmax):    2.55 cm     PV Vmax:          0.82 m/s AV Area (Vmean):   2.08 cm     PV Peak grad:     2.7 mmHg AV Area (VTI):     2.48 cm     PR End Diast Vel: 2.57 msec AV Vmax:           110.00 cm/s AV Vmean:          81.100 cm/s AV VTI:            0.239 m AV Peak Grad:      4.8 mmHg AV Mean Grad:      3.0 mmHg LVOT Vmax:         99.00 cm/s LVOT Vmean:        59.500 cm/s LVOT VTI:          0.209 m LVOT/AV VTI ratio: 0.87  AORTA Ao Root diam: 3.30 cm Ao Asc diam:  3.10 cm MITRAL VALVE               TRICUSPID VALVE MV Area (PHT): 2.71 cm    TR Peak grad:   29.8 mmHg MV Area VTI:   2.77 cm    TR Vmax:        273.00 cm/s MV Peak grad:   2.1 mmHg MV Mean grad:  1.0 mmHg    SHUNTS MV Vmax:       0.72 m/s    Systemic VTI:  0.21 m MV Vmean:      48.6 cm/s   Systemic Diam: 1.90 cm MV Decel Time: 280 msec MV E velocity: 55.50 cm/s MV A velocity: 53.50 cm/s MV E/A ratio:  1.04 Riley Lam MD Electronically signed by Riley Lam MD Signature Date/Time: 11/27/2023/11:49:03 AM    Final     Vitals:   11/27/23 0300 11/27/23 0330 11/27/23 0430 11/27/23 0440  BP: (!) 134/57 131/70 119/72   Pulse: 83 81 87   Resp: (!) 27 (!) 22 Marland Kitchen)  28   Temp:    98.2 F (36.8 C)  TempSrc:    Oral  SpO2: 94% 92% 91%   Weight:      Height:         PHYSICAL EXAM General:  Alert, thin appearing, elderly patient in no acute distress Psych:  Mood and affect appropriate for situation CV: Regular rate and rhythm on monitor Respiratory:  Regular, unlabored respirations on room air GI: Abdomen soft and nontender   NEURO:  Mental Status: Patient is alert and responds to her name but is unable to state where she is and is also disoriented to time and situation Speech/Language: speech is fluent and without dysarthria.  She is able to name 2 out of 3 objects and can repeat sentences easily.  Cranial Nerves:  II: PERRL. Visual fields full.  III, IV, VI: EOMI. Eyelids elevate symmetrically.  V: Sensation is intact to light touch and symmetrical to face.  VII: Right facial droop VIII: hearing intact to voice. IX, X: Phonation is normal.  XII: tongue is midline without fasciculations. Motor: Able to move all 4 extremities with good antigravity strength Tone: is normal and bulk is normal Sensation- Intact to light touch bilaterally.  Coordination: FTN intact bilaterally Gait- deferred  Most Recent NIH  1a Level of Conscious.: 0 1b LOC Questions: 2 1c LOC Commands: 0 2 Best Gaze: 0 3 Visual: 0 4 Facial Palsy: 1 5a Motor Arm - left: 0 5b Motor Arm - Right: 0 6a Motor Leg - Left: 0 6b Motor Leg - Right: 0 7 Limb Ataxia: 0 8  Sensory: 0 9 Best Language: 1 10 Dysarthria: 0 11 Extinct. and Inatten.: 0 TOTAL: 4   ASSESSMENT/PLAN  Left-sided TIA vs. Encephalopathy with dementia and ? UTI Code Stroke CT head No acute abnormality. Small vessel disease. Atrophy. ASPECTS 10.    CTA head & neck pending MRI no acute abnormality, atrophy, chronic small vessel ischemic changes and slight enlargement of lateral and third ventricles with wide callosal angle, likely due to atrophy 2D Echo EF 65 to 70%, normal atrial septum LDL 53 HgbA1c 5.7 UDS neg EEG pending VTE prophylaxis -SCDs No antithrombotic prior to admission, now on aspirin 81 mg daily and clopidogrel 75 mg daily for 3 weeks and then aspirin alone. Therapy recommendations:  Pending Disposition: pending  Hypertension Home meds: Irbesartan 75 mg daily Stable Maintain normotension  Hyperlipidemia Home meds: Pravastatin 40 mg daily, resumed in hospital LDL 53, goal < 70 Continue pravastatin 40 High intensity statin not indicated as LDL below goal Continue statin at discharge  ?UTI UA WBC 11-20 with Nitrate + On rocephin x 3   Other Stroke Risk Factors Advanced age  Other Active Problems Essential tremor  UTI-treatment per primary team  Hospital day # 0  Patient seen by NP with MD, MD to edit note as needed. Cortney E Ernestina Columbia , MSN, AGACNP-BC [facility] See Amion for schedule and pager information 11/27/2023 3:00 PM  ATTENDING NOTE: I reviewed above note and agree with the assessment and plan. Pt was seen and examined.   Son and echo the are at the bedside. Pt reclining in bed, awake, alert, eyes open, orientated to people but not able to tell me the place or time, told me age "62 something". Able to follow simple commands but needed repetitive requests, limited language output, able to name 2/3 simple common subjects, able to repeat simple sentence with prompt. Significant essential tremor with tremulous voice. No gaze  palsy, tracking bilaterally, blinking to visual threat bilaterally. No significant facial droop. Tongue midline. Bilateral UEs 4/5, no drift. Bilaterally LEs 3/5 proximal and 4/5 distally. Sensation not cooperative due to cognitive impairment. FTN exam grossly intact if count for significant essential tremor bilaterally, gait not tested.  Per son, pt had acute symptoms of leaning to right and right facial droop. Now much improved. MRI neg. Not sure if TIA vs. Encephalopathy due to dementia and UTI. Now on rocephin. Currently CTA head and neck as well as EEG pending. On DAPT and continue statin. Will follow.   For detailed assessment and plan, please refer to above/below as I have made changes wherever appropriate.   Marvel Plan, MD PhD Stroke Neurology 11/27/2023 4:03 PM      To contact Stroke Continuity provider, please refer to [email or web address]. After hours, contact General Neurology

## 2023-11-27 NOTE — Care Management Obs Status (Signed)
MEDICARE OBSERVATION STATUS NOTIFICATION   Patient Details  Name: [name] MRN: [id number] Date of Birth: 12/06/1945   Medicare Observation Status Notification Given:  Yes    Lawerance Sabal, RN 11/27/2023, 9:29 AM

## 2023-11-27 NOTE — Progress Notes (Addendum)
EEG complete - results pending 

## 2023-11-27 NOTE — ED Notes (Addendum)
Phone report given to Leeann Must, RN at [facility] at this time

## 2023-11-27 NOTE — ED Notes (Signed)
[facility] transport arranged at this time

## 2023-11-28 DIAGNOSIS — R29810 Facial weakness: Secondary | ICD-10-CM | POA: Diagnosis not present

## 2023-11-28 DIAGNOSIS — I10 Essential (primary) hypertension: Secondary | ICD-10-CM | POA: Diagnosis not present

## 2023-11-28 MED ORDER — CEFADROXIL 500 MG PO CAPS: 500.0000 mg | ORAL_CAPSULE | Freq: Two times a day (BID) | ORAL | 0 refills | Status: AC

## 2023-11-28 MED ORDER — ASPIRIN 81 MG PO TBEC: 81.0000 mg | DELAYED_RELEASE_TABLET | Freq: Every day | ORAL | 12 refills | Status: AC

## 2023-11-28 MED ORDER — CLOPIDOGREL BISULFATE 75 MG PO TABS: 75.0000 mg | ORAL_TABLET | Freq: Every day | ORAL | 0 refills | Status: AC

## 2023-11-28 NOTE — Evaluation (Signed)
Physical Therapy Evaluation Patient Details Name: Barabara Stayton MRN: [id number] DOB: 14-Sep-1946 Today's Date: 11/28/2023  History of Present Illness  Pt is a 78 y/o F presenting to ED on 1/18 from ALF, found on floor with R weakness after fall. MRI negative for acute findings. PMH includes dementia, fibromyalgia, HTN, osteoporosis, tremor  Clinical Impression  Pt presents with admitting diagnosis above. Co-treat with OT. Pt today was able to ambulate short distance in room with +2 Min A HHA. Pt initally ambulated with +2 HHA Min A however noted to lean posteriorly with a very narrow gait pattern. Pt appeared to be more steady and not leaning posteriorly with 1 person HHA vs 2 person HHA. Difficulty following commands due to baseline dementia. Pt son present and reports that pt was fully independent not too long ago however had a fall in November 2024 that resulted in a wrist fx where pt required more assistance. Given that pt appears to be far from baseline patient will benefit from continued inpatient follow up therapy, <3 hours/day. PT will continue to follow.          If plan is discharge home, recommend the following: A lot of help with walking and/or transfers;A little help with bathing/dressing/bathroom;Assistance with cooking/housework;Direct supervision/assist for medications management;Supervision due to cognitive status;Help with stairs or ramp for entrance;Assist for transportation;Direct supervision/assist for financial management   Can travel by private vehicle   Yes    Equipment Recommendations Other (comment) (Per accepting facility)  Recommendations for Other Services       Functional Status Assessment Patient has had a recent decline in their functional status and demonstrates the ability to make significant improvements in function in a reasonable and predictable amount of time.     Precautions / Restrictions Precautions Precautions: Fall Precaution Comments: Central  tremor noted Restrictions Weight Bearing Restrictions Per Provider Order: No      Mobility  Bed Mobility Overal bed mobility: Needs Assistance Bed Mobility: Sit to Supine, Supine to Sit     Supine to sit: Contact guard Sit to supine: Contact guard assist        Transfers Overall transfer level: Needs assistance Equipment used: 2 person hand held assist, 1 person hand held assist Transfers: Sit to/from Stand Sit to Stand: Min assist, +2 physical assistance                Ambulation/Gait Ambulation/Gait assistance: +2 physical assistance, Min assist, +2 safety/equipment Gait Distance (Feet): 10 Feet Assistive device: 2 person hand held assist, 1 person hand held assist Gait Pattern/deviations: Leaning posteriorly, Narrow base of support, Decreased stride length, Step-through pattern Gait velocity: decreased     General Gait Details: Pt initally ambulated with +2 HHA Min A however noted to lean posteriorly with a very narrow gait pattern. Pt appeared to be more steady and not leaning posteriorly with 1 person HHA vs 2 person HHA. Difficulty following commands due to baseline dementia.  Stairs            Wheelchair Mobility     Tilt Bed    Modified Rankin (Stroke Patients Only)       Balance Overall balance assessment: Needs assistance Sitting-balance support: Feet supported Sitting balance-Leahy Scale: Good     Standing balance support: During functional activity, Reliant on assistive device for balance Standing balance-Leahy Scale: Poor Standing balance comment: reliant on external support  Pertinent Vitals/Pain Pain Assessment Pain Assessment: No/denies pain    Home Living Family/patient expects to be discharged to:: Assisted living                 Home Equipment: Shower seat - built in      Prior Function Prior Level of Function : Independent/Modified Independent;History of Falls (last six  months) (2 falls in the last 6 months. Last fall resulted in wrist fracture for 9 weeks.)             Mobility Comments: Pt sons states that previous she was independent with no AD however recently has needed more assistance. ADLs Comments: "stand by assistance for showering" per son mostly due to wrist fx     Extremity/Trunk Assessment   Upper Extremity Assessment Upper Extremity Assessment: Generalized weakness    Lower Extremity Assessment Lower Extremity Assessment: Generalized weakness    Cervical / Trunk Assessment Cervical / Trunk Assessment: Normal  Communication   Communication Communication: No apparent difficulties  Cognition Arousal: Alert Behavior During Therapy: WFL for tasks assessed/performed Overall Cognitive Status: History of cognitive impairments - at baseline                                 General Comments: hx of dementia        General Comments General comments (skin integrity, edema, etc.): VSS. HR up to 130s during ambulation.    Exercises     Assessment/Plan    PT Assessment Patient needs continued PT services  PT Problem List Decreased strength;Decreased range of motion;Decreased activity tolerance;Decreased balance;Decreased mobility;Decreased coordination;Decreased cognition;Decreased knowledge of use of DME;Decreased safety awareness;Decreased knowledge of precautions;Cardiopulmonary status limiting activity       PT Treatment Interventions DME instruction;Gait training;Stair training;Functional mobility training;Therapeutic activities;Therapeutic exercise;Balance training;Neuromuscular re-education;Cognitive remediation;Patient/family education    PT Goals (Current goals can be found in the Care Plan section)  Acute Rehab PT Goals Patient Stated Goal: to get stronger PT Goal Formulation: With patient Time For Goal Achievement: 12/12/23 Potential to Achieve Goals: Fair    Frequency Min 1X/week     Co-evaluation  PT/OT/SLP Co-Evaluation/Treatment: Yes Reason for Co-Treatment: Complexity of the patient's impairments (multi-system involvement);To address functional/ADL transfers;For patient/therapist safety PT goals addressed during session: Mobility/safety with mobility OT goals addressed during session: Strengthening/ROM;ADL's and self-care       AM-PAC PT "6 Clicks" Mobility  Outcome Measure Help needed turning from your back to your side while in a flat bed without using bedrails?: A Little Help needed moving from lying on your back to sitting on the side of a flat bed without using bedrails?: A Little Help needed moving to and from a bed to a chair (including a wheelchair)?: A Little Help needed standing up from a chair using your arms (e.g., wheelchair or bedside chair)?: A Little Help needed to walk in hospital room?: A Lot Help needed climbing 3-5 steps with a railing? : Total 6 Click Score: 15    End of Session Equipment Utilized During Treatment: Gait belt Activity Tolerance: Patient tolerated treatment well Patient left: in bed;with call bell/phone within reach;with family/visitor present Nurse Communication: Mobility status PT Visit Diagnosis: Other abnormalities of gait and mobility (R26.89)    Time: 2841-3244 PT Time Calculation (min) (ACUTE ONLY): 20 min   Charges:   PT Evaluation $PT Eval Moderate Complexity: 1 Mod   PT General Charges $$ ACUTE PT VISIT: 1 Visit  Lonell Face, DPT Acute Rehab Services [phone]   Gladys Damme 11/28/2023, 11:35 AM

## 2023-11-28 NOTE — Discharge Summary (Addendum)
PATIENT DETAILS Name: [name] Age: 78 y.o. Sex: female Date of Birth: 07-May-1946 MRN: [id number]. Admitting Physician: Calvert Cantor, MD [id number], Loraine Leriche, MD  Admit Date: 11/26/2023 Discharge date: 11/28/2023  Recommendations for Outpatient Follow-up:  Follow up with PCP in 1-2 weeks Please obtain CMP/CBC in one week  Admitted From:  ALF   Disposition: Assisted living   Discharge Condition: good  CODE STATUS:   Code Status: Limited: Do not attempt resuscitation (DNR) -DNR-LIMITED -Do Not Intubate/DNI    Diet recommendation:  Diet Order             Diet - low sodium heart healthy           Diet regular Room service appropriate? No; Fluid consistency: Thin  Diet effective now                    Brief Summary: Patient is a 78 y.o.  female with history of HTN, dementia-who lives in assisted living-found on the floor with left facial droop   Significant events: 1/18>> admit to [facility]   Significant studies: 1/18>> CT head: No acute findings. 1/18>> A1c: 5.7 1/19>> LDL: 53 1/19>> MRI brain: No acute CVA 1/19>> CT angio head/neck: No LVO/significant stenosis 1/19>> echo: EF 65-70% 1/19>>EEG:No seizure   Significant microbiology data: None   Procedures: None   Consults: Neurology  Brief Hospital Course: TIA Apparently had left facial droop when she first presented-which has essentially resolved-no other focal deficits on exam Extensive workup negative Recommendations from stroke MD-aspirin/Plavix-x 3 weeks followed by aspirin alone Although UA suggestive of UTI-she does not have any symptoms-UTI would not cause her to have a left facial droop-in any event-she will be treated empirically x 3 days total.  Suspect this is asymptomatic bacteriuria rather than UTI. Plan is to discharge her back to ALF today.  Asymptomatic bacteriuria See above   HTN BP stable Initially permissive hypertension was allowed-resume Avapro on discharge.    Dementia continue galantamine Delirium precautions   BMI: Estimated body mass index is 22.84 kg/m as calculated from the following:   Height as of this encounter: 5' 4.5" (1.638 m).   Weight as of this encounter: 61.3 kg.    Discharge Diagnoses:  Principal Problem:   Facial droop Active Problems:   HTN (hypertension)   Discharge Instructions:  Activity:  As tolerated with Full fall precautions use walker/cane & assistance as needed Discharge Instructions     Diet - low sodium heart healthy   Complete by: As directed    Discharge instructions   Complete by: As directed    Follow with Primary MD  Rodrigo Ran, MD in 1-2 weeks  Take aspirin/Plavix x 3 weeks-after 3 weeks-stop Plavix and continue on aspirin  Please get a complete blood count and chemistry panel checked by your Primary MD at your next visit, and again as instructed by your Primary MD.  Get Medicines reviewed and adjusted: Please take all your medications with you for your next visit with your Primary MD  Laboratory/radiological data: Please request your Primary MD to go over all hospital tests and procedure/radiological results at the follow up, please ask your Primary MD to get all Hospital records sent to his/her office.  In some cases, they will be blood work, cultures and biopsy results pending at the time of your discharge. Please request that your primary care M.D. follows up on these results.  Also Note the following: If you experience worsening of your admission symptoms, develop shortness  of breath, life threatening emergency, suicidal or homicidal thoughts you must seek medical attention immediately by calling 911 or calling your MD immediately  if symptoms less severe.  You must read complete instructions/literature along with all the possible adverse reactions/side effects for all the Medicines you take and that have been prescribed to you. Take any new Medicines after you have completely  understood and accpet all the possible adverse reactions/side effects.   Do not drive when taking Pain medications or sleeping medications (Benzodaizepines)  Do not take more than prescribed Pain, Sleep and Anxiety Medications. It is not advisable to combine anxiety,sleep and pain medications without talking with your primary care practitioner  Special Instructions: If you have smoked or chewed Tobacco  in the last 2 yrs please stop smoking, stop any regular Alcohol  and or any Recreational drug use.  Wear Seat belts while driving.  Please note: You were cared for by a hospitalist during your hospital stay. Once you are discharged, your primary care physician will handle any further medical issues. Please note that NO REFILLS for any discharge medications will be authorized once you are discharged, as it is imperative that you return to your primary care physician (or establish a relationship with a primary care physician if you do not have one) for your post hospital discharge needs so that they can reassess your need for medications and monitor your lab values.   Increase activity slowly   Complete by: As directed       Allergies as of 11/28/2023       Reactions   Amoxicillin Rash   Egg-derived Products Hives, Swelling   Egg whites cause swelling and hives. Pt states she can eat eggs cooked.        Medication List     TAKE these medications    acetaminophen 325 MG tablet Commonly known as: TYLENOL Take 650 mg by mouth every 6 (six) hours as needed for mild pain (pain score 1-3) or moderate pain (pain score 4-6).   aspirin EC 81 MG tablet Take 1 tablet (81 mg total) by mouth daily. Swallow whole. Start taking on: November 29, 2023   cefadroxil 500 MG capsule Commonly known as: DURICEF Take 1 capsule (500 mg total) by mouth 2 (two) times daily for 1 day. Start taking on: November 29, 2023   clopidogrel 75 MG tablet Commonly known as: PLAVIX Take 1 tablet (75 mg total) by  mouth daily for 21 days. Start taking on: November 29, 2023   galantamine 16 MG 24 hr capsule Commonly known as: RAZADYNE ER Take 16 mg by mouth every morning.   guaiFENesin 600 MG 12 hr tablet Commonly known as: MUCINEX Take 600 mg by mouth 2 (two) times daily as needed for cough.   irbesartan 75 MG tablet Commonly known as: AVAPRO Take 75 mg by mouth daily.   melatonin 3 MG Tabs tablet Take 3 mg by mouth at bedtime.   multivitamin tablet Take 1 tablet by mouth daily.   pravastatin 40 MG tablet Commonly known as: PRAVACHOL Take 40 mg by mouth daily.   sertraline 50 MG tablet Commonly known as: ZOLOFT Take 50 mg by mouth daily.        Follow-up Information     Rodrigo Ran, MD. Schedule an appointment as soon as possible for a visit in 1 week(s).   Specialty: Internal Medicine Contact information: [address] [place] [address] [phone]  Allergies  Allergen Reactions   Amoxicillin Rash   Egg-Derived Products Hives and Swelling    Egg whites cause swelling and hives. Pt states she can eat eggs cooked.     Other Procedures/Studies: CT ANGIO HEAD NECK W WO CM Result Date: 11/27/2023 CLINICAL DATA:  Stroke/TIA EXAM: CT ANGIOGRAPHY HEAD AND NECK WITH AND WITHOUT CONTRAST TECHNIQUE: Multidetector CT imaging of the head and neck was performed using the standard protocol during bolus administration of intravenous contrast. Multiplanar CT image reconstructions and MIPs were obtained to evaluate the vascular anatomy. Carotid stenosis measurements (when applicable) are obtained utilizing NASCET criteria, using the distal internal carotid diameter as the denominator. RADIATION DOSE REDUCTION: This exam was performed according to the departmental dose-optimization program which includes automated exposure control, adjustment of the mA and/or kV according to patient size and/or use of iterative reconstruction technique. CONTRAST:  75mL OMNIPAQUE  IOHEXOL 350 MG/ML SOLN COMPARISON:  None Available. FINDINGS: CTA NECK FINDINGS Skeleton: No acute abnormality or high grade bony spinal canal stenosis. Other neck: Normal pharynx, larynx and major salivary glands. No cervical lymphadenopathy. Unremarkable thyroid gland. Upper chest: No pneumothorax or pleural effusion. No nodules or masses. Aortic arch: There is calcific atherosclerosis of the aortic arch. Conventional 3 vessel aortic branching pattern. RIGHT carotid system: Normal without aneurysm, dissection or stenosis. LEFT carotid system: No dissection, occlusion or aneurysm. Mild atherosclerotic calcification at the carotid bifurcation without hemodynamically significant stenosis. Vertebral arteries: Left dominant configuration. There is no dissection, occlusion or flow-limiting stenosis to the skull base (V1-V3 segments). CTA HEAD FINDINGS POSTERIOR CIRCULATION: Vertebral arteries are normal. No proximal occlusion of the anterior or inferior cerebellar arteries. Basilar artery is normal. Superior cerebellar arteries are normal. Posterior cerebral arteries are normal. ANTERIOR CIRCULATION: Atherosclerotic calcification of the internal carotid arteries at the skull base without hemodynamically significant stenosis. Anterior cerebral arteries are normal. Middle cerebral arteries are normal. Venous sinuses: As permitted by contrast timing, patent. Anatomic variants: None Review of the MIP images confirms the above findings. IMPRESSION: 1. No emergent large vessel occlusion or hemodynamically significant stenosis of the head or neck. Aortic Atherosclerosis (ICD10-I70.0). Electronically Signed   By: Deatra Robinson M.D.   On: 11/27/2023 23:04   EEG adult Result Date: 11/27/2023 Jefferson Fuel, MD     11/27/2023  8:55 PM Routine EEG Report [name] is a 78 y.o. female with a history of altered mental status who is undergoing an EEG to evaluate for seizures. Report: This EEG was acquired with electrodes  placed according to the International 10-20 electrode system (including Fp1, Fp2, F3, F4, C3, C4, P3, P4, O1, O2, T3, T4, T5, T6, A1, A2, Fz, Cz, Pz). The following electrodes were missing or displaced: none. The occipital dominant rhythm was 4-6 Hz. This activity is reactive to stimulation. Drowsiness was manifested by background fragmentation; deeper stages of sleep were not identified. There was no focal slowing. There were no interictal epileptiform discharges. There were no electrographic seizures identified. Photic stimulation and hyperventilation were not performed. Impression and clinical correlation: This EEG was obtained while awake and drowsy and is abnormal due to moderate-to-severe diffuse slowing indicative of global cerebral dysfunction. Epileptiform abnormalities were not seen during this recording. Bing Neighbors, MD [facility] [phone] If 7pm- 7am, please page neurology on call as listed in AMION.   MR BRAIN WO CONTRAST Result Date: 11/27/2023 CLINICAL DATA:  Stroke.  Follow-up. EXAM: MRI HEAD WITHOUT CONTRAST TECHNIQUE: Multiplanar, multiecho pulse sequences of the brain and surrounding structures were  obtained without intravenous contrast. COMPARISON:  CT yesterday. FINDINGS: Brain: Diffusion imaging does not show any acute or subacute infarction or other cause of restricted diffusion. No focal abnormality affects the brainstem or cerebellum. Cerebral hemispheres show generalized atrophy without subjective lobar predominance. There are minimal chronic small-vessel ischemic changes of the white matter. Slight enlargement of the lateral and third ventricles since the study of 2021. This could be due to progressive brain atrophy, but the possibility of normal pressure hydrocephalus does exist. Vascular: Major vessels at the base of the brain show flow. Skull and upper cervical spine: Negative Sinuses/Orbits: Clear/normal Other: None IMPRESSION: 1. No acute finding by MRI.  Generalized atrophy without subjective lobar predominance. Minimal chronic small-vessel ischemic changes of the white matter. 2. Slight enlargement of the lateral and third ventricles since the study of 2021. This could be due to progressive brain atrophy, but the possibility of normal pressure hydrocephalus does exist. Electronically Signed   By: Paulina Fusi M.D.   On: 11/27/2023 13:57   ECHOCARDIOGRAM COMPLETE Result Date: 11/27/2023    ECHOCARDIOGRAM REPORT   Patient Name:   [name] Date of Exam: 11/27/2023 Medical Rec #:  [id number]       Height:       64.5 in Accession #:    [id number]      Weight:       135.1 lb Date of Birth:  28-Feb-1946      BSA:          1.665 m Patient Age:    77 years        BP:           122/63 mmHg Patient Gender: F               HR:           88 bpm. Exam Location:  Inpatient Procedure: 2D Echo, Cardiac Doppler and Color Doppler Indications:    Stroke  History:        Patient has prior history of Echocardiogram examinations, most                 recent 02/11/2021. Signs/Symptoms:Chest Pain; Risk                 Factors:Hypertension and Dyslipidemia.  Sonographer:    Vern Claude Referring Phys: Calvert Cantor IMPRESSIONS  1. Left ventricular ejection fraction, by estimation, is 65 to 70%. The left ventricle has normal function. The left ventricle has no regional wall motion abnormalities. Left ventricular diastolic parameters were normal.  2. Right ventricular systolic function is normal. The right ventricular size is normal. There is normal pulmonary artery systolic pressure.  3. The mitral valve is normal in structure. No evidence of mitral valve regurgitation. No evidence of mitral stenosis.  4. The aortic valve is tricuspid. There is mild thickening of the aortic valve. Aortic valve regurgitation is mild. No aortic stenosis is present. Comparison(s): Prior images reviewed side by side. Left ventricular function is more vigorous. FINDINGS  Left Ventricle: Left ventricular  ejection fraction, by estimation, is 65 to 70%. The left ventricle has normal function. The left ventricle has no regional wall motion abnormalities. The left ventricular internal cavity size was normal in size. There is  no left ventricular hypertrophy. Left ventricular diastolic parameters were normal. Right Ventricle: The right ventricular size is normal. No increase in right ventricular wall thickness. Right ventricular systolic function is normal. There is normal pulmonary artery systolic pressure. The tricuspid regurgitant velocity is  2.73 m/s, and  with an assumed right atrial pressure of 3 mmHg, the estimated right ventricular systolic pressure is 32.8 mmHg. Left Atrium: Left atrial size was normal in size. Right Atrium: Right atrial size was normal in size. Pericardium: There is no evidence of pericardial effusion. Mitral Valve: The mitral valve is normal in structure. No evidence of mitral valve regurgitation. No evidence of mitral valve stenosis. MV peak gradient, 2.1 mmHg. The mean mitral valve gradient is 1.0 mmHg. Tricuspid Valve: The tricuspid valve is normal in structure. Tricuspid valve regurgitation is not demonstrated. No evidence of tricuspid stenosis. Aortic Valve: The aortic valve is tricuspid. There is mild thickening of the aortic valve. Aortic valve regurgitation is mild. No aortic stenosis is present. Aortic valve mean gradient measures 3.0 mmHg. Aortic valve peak gradient measures 4.8 mmHg. Aortic valve area, by VTI measures 2.48 cm. Pulmonic Valve: The pulmonic valve was normal in structure. Pulmonic valve regurgitation is mild. No evidence of pulmonic stenosis. Aorta: The aortic root and ascending aorta are structurally normal, with no evidence of dilitation. IAS/Shunts: The atrial septum is grossly normal.  LEFT VENTRICLE PLAX 2D LVIDd:         4.30 cm      Diastology LVIDs:         2.80 cm      LV e' medial:    9.46 cm/s LV PW:         1.00 cm      LV E/e' medial:  5.9 LV IVS:         0.70 cm      LV e' lateral:   9.25 cm/s LVOT diam:     1.90 cm      LV E/e' lateral: 6.0 LV SV:         59 LV SV Index:   36 LVOT Area:     2.84 cm  LV Volumes (MOD) LV vol d, MOD A2C: 111.0 ml LV vol d, MOD A4C: 108.0 ml LV vol s, MOD A2C: 32.5 ml LV vol s, MOD A4C: 41.0 ml LV SV MOD A2C:     78.5 ml LV SV MOD A4C:     108.0 ml LV SV MOD BP:      73.3 ml RIGHT VENTRICLE             IVC RV Basal diam:  3.90 cm     IVC diam: 1.90 cm RV Mid diam:    2.50 cm RV S prime:     14.90 cm/s TAPSE (M-mode): 2.6 cm LEFT ATRIUM             Index        RIGHT ATRIUM           Index LA diam:        2.40 cm 1.44 cm/m   RA Area:     14.50 cm LA Vol (A2C):   68.5 ml 41.14 ml/m  RA Volume:   35.80 ml  21.50 ml/m LA Vol (A4C):   36.1 ml 21.68 ml/m LA Biplane Vol: 53.1 ml 31.89 ml/m  AORTIC VALVE                    PULMONIC VALVE AV Area (Vmax):    2.55 cm     PV Vmax:          0.82 m/s AV Area (Vmean):   2.08 cm     PV Peak grad:     2.7 mmHg AV  Area (VTI):     2.48 cm     PR End Diast Vel: 2.57 msec AV Vmax:           110.00 cm/s AV Vmean:          81.100 cm/s AV VTI:            0.239 m AV Peak Grad:      4.8 mmHg AV Mean Grad:      3.0 mmHg LVOT Vmax:         99.00 cm/s LVOT Vmean:        59.500 cm/s LVOT VTI:          0.209 m LVOT/AV VTI ratio: 0.87  AORTA Ao Root diam: 3.30 cm Ao Asc diam:  3.10 cm MITRAL VALVE               TRICUSPID VALVE MV Area (PHT): 2.71 cm    TR Peak grad:   29.8 mmHg MV Area VTI:   2.77 cm    TR Vmax:        273.00 cm/s MV Peak grad:  2.1 mmHg MV Mean grad:  1.0 mmHg    SHUNTS MV Vmax:       0.72 m/s    Systemic VTI:  0.21 m MV Vmean:      48.6 cm/s   Systemic Diam: 1.90 cm MV Decel Time: 280 msec MV E velocity: 55.50 cm/s MV A velocity: 53.50 cm/s MV E/A ratio:  1.04 Riley Lam MD Electronically signed by Riley Lam MD Signature Date/Time: 11/27/2023/11:49:03 AM    Final    CT HEAD CODE STROKE WO CONTRAST Result Date: 11/26/2023 CLINICAL DATA:  Code stroke. Transient  ischemic attack. Neurological deficit, acute, stroke suspected. Found on the floor. EXAM: CT HEAD WITHOUT CONTRAST TECHNIQUE: Contiguous axial images were obtained from the base of the skull through the vertex without intravenous contrast. RADIATION DOSE REDUCTION: This exam was performed according to the departmental dose-optimization program which includes automated exposure control, adjustment of the mA and/or kV according to patient size and/or use of iterative reconstruction technique. COMPARISON:  09/27/2022 FINDINGS: Brain: Age related volume loss without subjective lobar predominance. Chronic small-vessel ischemic changes of the white matter with central volume loss. No sign of acute infarction, mass lesion, hemorrhage, hydrocephalus or extra-axial collection. Vascular: There is atherosclerotic calcification of the major vessels at the base of the brain. Skull: Negative Sinuses/Orbits: Clear/normal Other: None ASPECTS (Alberta Stroke Program Early CT Score) - Ganglionic level infarction (caudate, lentiform nuclei, internal capsule, insula, M1-M3 cortex): 7 - Supraganglionic infarction (M4-M6 cortex): 3 Total score (0-10 with 10 being normal): 10 IMPRESSION: 1. No acute CT finding. Age related volume loss and chronic small-vessel ischemic changes of the white matter. 2. Aspects is 10. 3. These results were communicated to Dr. Selina Cooley at 1:11 pm on 11/26/2023 by text page via the [facility] messaging system. Electronically Signed   By: Paulina Fusi M.D.   On: 11/26/2023 13:11     TODAY-DAY OF DISCHARGE:  Subjective:   [name] today has no headache,no chest abdominal pain,no new weakness tingling or numbness, feels much better wants to go home today.   Objective:   Blood pressure (!) 140/71, pulse 82, temperature 98.2 F (36.8 C), temperature source Oral, resp. rate (!) 22, height 5' 4.5" (1.638 m), weight 61.3 kg, SpO2 93%.  Intake/Output Summary (Last 24 hours) at 11/28/2023 1006 Last data  filed at 11/28/2023 0300 Gross per 24 hour  Intake --  Output 600 ml  Net -600 ml   Filed Weights   11/26/23 1238  Weight: 61.3 kg    Exam: Awake Alert, Oriented *3, No new F.N deficits, Normal affect [place].AT,PERRAL Supple Neck,No JVD, No cervical lymphadenopathy appriciated.  Symmetrical Chest wall movement, Good air movement bilaterally, CTAB RRR,No Gallops,Rubs or new Murmurs, No Parasternal Heave +ve B.Sounds, Abd Soft, Non tender, No organomegaly appriciated, No rebound -guarding or rigidity. No Cyanosis, Clubbing or edema, No new Rash or bruise   PERTINENT RADIOLOGIC STUDIES: CT ANGIO HEAD NECK W WO CM Result Date: 11/27/2023 CLINICAL DATA:  Stroke/TIA EXAM: CT ANGIOGRAPHY HEAD AND NECK WITH AND WITHOUT CONTRAST TECHNIQUE: Multidetector CT imaging of the head and neck was performed using the standard protocol during bolus administration of intravenous contrast. Multiplanar CT image reconstructions and MIPs were obtained to evaluate the vascular anatomy. Carotid stenosis measurements (when applicable) are obtained utilizing NASCET criteria, using the distal internal carotid diameter as the denominator. RADIATION DOSE REDUCTION: This exam was performed according to the departmental dose-optimization program which includes automated exposure control, adjustment of the mA and/or kV according to patient size and/or use of iterative reconstruction technique. CONTRAST:  75mL OMNIPAQUE IOHEXOL 350 MG/ML SOLN COMPARISON:  None Available. FINDINGS: CTA NECK FINDINGS Skeleton: No acute abnormality or high grade bony spinal canal stenosis. Other neck: Normal pharynx, larynx and major salivary glands. No cervical lymphadenopathy. Unremarkable thyroid gland. Upper chest: No pneumothorax or pleural effusion. No nodules or masses. Aortic arch: There is calcific atherosclerosis of the aortic arch. Conventional 3 vessel aortic branching pattern. RIGHT carotid system: Normal without aneurysm, dissection or  stenosis. LEFT carotid system: No dissection, occlusion or aneurysm. Mild atherosclerotic calcification at the carotid bifurcation without hemodynamically significant stenosis. Vertebral arteries: Left dominant configuration. There is no dissection, occlusion or flow-limiting stenosis to the skull base (V1-V3 segments). CTA HEAD FINDINGS POSTERIOR CIRCULATION: Vertebral arteries are normal. No proximal occlusion of the anterior or inferior cerebellar arteries. Basilar artery is normal. Superior cerebellar arteries are normal. Posterior cerebral arteries are normal. ANTERIOR CIRCULATION: Atherosclerotic calcification of the internal carotid arteries at the skull base without hemodynamically significant stenosis. Anterior cerebral arteries are normal. Middle cerebral arteries are normal. Venous sinuses: As permitted by contrast timing, patent. Anatomic variants: None Review of the MIP images confirms the above findings. IMPRESSION: 1. No emergent large vessel occlusion or hemodynamically significant stenosis of the head or neck. Aortic Atherosclerosis (ICD10-I70.0). Electronically Signed   By: Deatra Robinson M.D.   On: 11/27/2023 23:04   EEG adult Result Date: 11/27/2023 Jefferson Fuel, MD     11/27/2023  8:55 PM Routine EEG Report [name] is a 78 y.o. female with a history of altered mental status who is undergoing an EEG to evaluate for seizures. Report: This EEG was acquired with electrodes placed according to the International 10-20 electrode system (including Fp1, Fp2, F3, F4, C3, C4, P3, P4, O1, O2, T3, T4, T5, T6, A1, A2, Fz, Cz, Pz). The following electrodes were missing or displaced: none. The occipital dominant rhythm was 4-6 Hz. This activity is reactive to stimulation. Drowsiness was manifested by background fragmentation; deeper stages of sleep were not identified. There was no focal slowing. There were no interictal epileptiform discharges. There were no electrographic seizures identified.  Photic stimulation and hyperventilation were not performed. Impression and clinical correlation: This EEG was obtained while awake and drowsy and is abnormal due to moderate-to-severe diffuse slowing indicative of global cerebral dysfunction. Epileptiform abnormalities were not seen during this  recording. Bing Neighbors, MD [facility] [phone] If 7pm- 7am, please page neurology on call as listed in AMION.   MR BRAIN WO CONTRAST Result Date: 11/27/2023 CLINICAL DATA:  Stroke.  Follow-up. EXAM: MRI HEAD WITHOUT CONTRAST TECHNIQUE: Multiplanar, multiecho pulse sequences of the brain and surrounding structures were obtained without intravenous contrast. COMPARISON:  CT yesterday. FINDINGS: Brain: Diffusion imaging does not show any acute or subacute infarction or other cause of restricted diffusion. No focal abnormality affects the brainstem or cerebellum. Cerebral hemispheres show generalized atrophy without subjective lobar predominance. There are minimal chronic small-vessel ischemic changes of the white matter. Slight enlargement of the lateral and third ventricles since the study of 2021. This could be due to progressive brain atrophy, but the possibility of normal pressure hydrocephalus does exist. Vascular: Major vessels at the base of the brain show flow. Skull and upper cervical spine: Negative Sinuses/Orbits: Clear/normal Other: None IMPRESSION: 1. No acute finding by MRI. Generalized atrophy without subjective lobar predominance. Minimal chronic small-vessel ischemic changes of the white matter. 2. Slight enlargement of the lateral and third ventricles since the study of 2021. This could be due to progressive brain atrophy, but the possibility of normal pressure hydrocephalus does exist. Electronically Signed   By: Paulina Fusi M.D.   On: 11/27/2023 13:57   ECHOCARDIOGRAM COMPLETE Result Date: 11/27/2023    ECHOCARDIOGRAM REPORT   Patient Name:   [name] Date of Exam: 11/27/2023  Medical Rec #:  [id number]       Height:       64.5 in Accession #:    [id number]      Weight:       135.1 lb Date of Birth:  28-May-1946      BSA:          1.665 m Patient Age:    77 years        BP:           122/63 mmHg Patient Gender: F               HR:           88 bpm. Exam Location:  Inpatient Procedure: 2D Echo, Cardiac Doppler and Color Doppler Indications:    Stroke  History:        Patient has prior history of Echocardiogram examinations, most                 recent 02/11/2021. Signs/Symptoms:Chest Pain; Risk                 Factors:Hypertension and Dyslipidemia.  Sonographer:    Vern Claude Referring Phys: Calvert Cantor IMPRESSIONS  1. Left ventricular ejection fraction, by estimation, is 65 to 70%. The left ventricle has normal function. The left ventricle has no regional wall motion abnormalities. Left ventricular diastolic parameters were normal.  2. Right ventricular systolic function is normal. The right ventricular size is normal. There is normal pulmonary artery systolic pressure.  3. The mitral valve is normal in structure. No evidence of mitral valve regurgitation. No evidence of mitral stenosis.  4. The aortic valve is tricuspid. There is mild thickening of the aortic valve. Aortic valve regurgitation is mild. No aortic stenosis is present. Comparison(s): Prior images reviewed side by side. Left ventricular function is more vigorous. FINDINGS  Left Ventricle: Left ventricular ejection fraction, by estimation, is 65 to 70%. The left ventricle has normal function. The left ventricle has no regional wall motion abnormalities. The left ventricular internal cavity size  was normal in size. There is  no left ventricular hypertrophy. Left ventricular diastolic parameters were normal. Right Ventricle: The right ventricular size is normal. No increase in right ventricular wall thickness. Right ventricular systolic function is normal. There is normal pulmonary artery systolic pressure. The tricuspid  regurgitant velocity is 2.73 m/s, and  with an assumed right atrial pressure of 3 mmHg, the estimated right ventricular systolic pressure is 32.8 mmHg. Left Atrium: Left atrial size was normal in size. Right Atrium: Right atrial size was normal in size. Pericardium: There is no evidence of pericardial effusion. Mitral Valve: The mitral valve is normal in structure. No evidence of mitral valve regurgitation. No evidence of mitral valve stenosis. MV peak gradient, 2.1 mmHg. The mean mitral valve gradient is 1.0 mmHg. Tricuspid Valve: The tricuspid valve is normal in structure. Tricuspid valve regurgitation is not demonstrated. No evidence of tricuspid stenosis. Aortic Valve: The aortic valve is tricuspid. There is mild thickening of the aortic valve. Aortic valve regurgitation is mild. No aortic stenosis is present. Aortic valve mean gradient measures 3.0 mmHg. Aortic valve peak gradient measures 4.8 mmHg. Aortic valve area, by VTI measures 2.48 cm. Pulmonic Valve: The pulmonic valve was normal in structure. Pulmonic valve regurgitation is mild. No evidence of pulmonic stenosis. Aorta: The aortic root and ascending aorta are structurally normal, with no evidence of dilitation. IAS/Shunts: The atrial septum is grossly normal.  LEFT VENTRICLE PLAX 2D LVIDd:         4.30 cm      Diastology LVIDs:         2.80 cm      LV e' medial:    9.46 cm/s LV PW:         1.00 cm      LV E/e' medial:  5.9 LV IVS:        0.70 cm      LV e' lateral:   9.25 cm/s LVOT diam:     1.90 cm      LV E/e' lateral: 6.0 LV SV:         59 LV SV Index:   36 LVOT Area:     2.84 cm  LV Volumes (MOD) LV vol d, MOD A2C: 111.0 ml LV vol d, MOD A4C: 108.0 ml LV vol s, MOD A2C: 32.5 ml LV vol s, MOD A4C: 41.0 ml LV SV MOD A2C:     78.5 ml LV SV MOD A4C:     108.0 ml LV SV MOD BP:      73.3 ml RIGHT VENTRICLE             IVC RV Basal diam:  3.90 cm     IVC diam: 1.90 cm RV Mid diam:    2.50 cm RV S prime:     14.90 cm/s TAPSE (M-mode): 2.6 cm LEFT ATRIUM              Index        RIGHT ATRIUM           Index LA diam:        2.40 cm 1.44 cm/m   RA Area:     14.50 cm LA Vol (A2C):   68.5 ml 41.14 ml/m  RA Volume:   35.80 ml  21.50 ml/m LA Vol (A4C):   36.1 ml 21.68 ml/m LA Biplane Vol: 53.1 ml 31.89 ml/m  AORTIC VALVE  PULMONIC VALVE AV Area (Vmax):    2.55 cm     PV Vmax:          0.82 m/s AV Area (Vmean):   2.08 cm     PV Peak grad:     2.7 mmHg AV Area (VTI):     2.48 cm     PR End Diast Vel: 2.57 msec AV Vmax:           110.00 cm/s AV Vmean:          81.100 cm/s AV VTI:            0.239 m AV Peak Grad:      4.8 mmHg AV Mean Grad:      3.0 mmHg LVOT Vmax:         99.00 cm/s LVOT Vmean:        59.500 cm/s LVOT VTI:          0.209 m LVOT/AV VTI ratio: 0.87  AORTA Ao Root diam: 3.30 cm Ao Asc diam:  3.10 cm MITRAL VALVE               TRICUSPID VALVE MV Area (PHT): 2.71 cm    TR Peak grad:   29.8 mmHg MV Area VTI:   2.77 cm    TR Vmax:        273.00 cm/s MV Peak grad:  2.1 mmHg MV Mean grad:  1.0 mmHg    SHUNTS MV Vmax:       0.72 m/s    Systemic VTI:  0.21 m MV Vmean:      48.6 cm/s   Systemic Diam: 1.90 cm MV Decel Time: 280 msec MV E velocity: 55.50 cm/s MV A velocity: 53.50 cm/s MV E/A ratio:  1.04 Riley Lam MD Electronically signed by Riley Lam MD Signature Date/Time: 11/27/2023/11:49:03 AM    Final    CT HEAD CODE STROKE WO CONTRAST Result Date: 11/26/2023 CLINICAL DATA:  Code stroke. Transient ischemic attack. Neurological deficit, acute, stroke suspected. Found on the floor. EXAM: CT HEAD WITHOUT CONTRAST TECHNIQUE: Contiguous axial images were obtained from the base of the skull through the vertex without intravenous contrast. RADIATION DOSE REDUCTION: This exam was performed according to the departmental dose-optimization program which includes automated exposure control, adjustment of the mA and/or kV according to patient size and/or use of iterative reconstruction technique. COMPARISON:  09/27/2022  FINDINGS: Brain: Age related volume loss without subjective lobar predominance. Chronic small-vessel ischemic changes of the white matter with central volume loss. No sign of acute infarction, mass lesion, hemorrhage, hydrocephalus or extra-axial collection. Vascular: There is atherosclerotic calcification of the major vessels at the base of the brain. Skull: Negative Sinuses/Orbits: Clear/normal Other: None ASPECTS (Alberta Stroke Program Early CT Score) - Ganglionic level infarction (caudate, lentiform nuclei, internal capsule, insula, M1-M3 cortex): 7 - Supraganglionic infarction (M4-M6 cortex): 3 Total score (0-10 with 10 being normal): 10 IMPRESSION: 1. No acute CT finding. Age related volume loss and chronic small-vessel ischemic changes of the white matter. 2. Aspects is 10. 3. These results were communicated to Dr. Selina Cooley at 1:11 pm on 11/26/2023 by text page via the [facility] messaging system. Electronically Signed   By: Paulina Fusi M.D.   On: 11/26/2023 13:11     PERTINENT LAB RESULTS: CBC: Recent Labs    11/26/23 1251 11/27/23 0913  WBC 12.9* 9.3  HGB 13.2 11.5*  HCT 39.7 34.3*  PLT 220 192   CMET CMP     Component  Value Date/Time   NA 137 11/27/2023 0913   K 3.7 11/27/2023 0913   CL 105 11/27/2023 0913   CO2 23 11/27/2023 0913   GLUCOSE 101 (H) 11/27/2023 0913   BUN 10 11/27/2023 0913   CREATININE 0.63 11/27/2023 0913   CALCIUM 8.4 (L) 11/27/2023 0913   PROT 5.7 (L) 11/27/2023 0913   ALBUMIN 3.0 (L) 11/27/2023 0913   AST 112 (H) 11/27/2023 0913   ALT 47 (H) 11/27/2023 0913   ALKPHOS 67 11/27/2023 0913   BILITOT 1.0 11/27/2023 0913   GFRNONAA >60 11/27/2023 0913    GFR Estimated Creatinine Clearance: 51.1 mL/min (by C-G formula based on SCr of 0.63 mg/dL). No results for input(s): "LIPASE", "AMYLASE" in the last 72 hours. No results for input(s): "CKTOTAL", "CKMB", "CKMBINDEX", "TROPONINI" in the last 72 hours. Invalid input(s): "POCBNP" No results for input(s):  "DDIMER" in the last 72 hours. Recent Labs    11/26/23 1241  HGBA1C 5.7*   Recent Labs    11/27/23 0913  CHOL 129  HDL 63  LDLCALC 53  TRIG 63  CHOLHDL 2.0   No results for input(s): "TSH", "T4TOTAL", "T3FREE", "THYROIDAB" in the last 72 hours.  Invalid input(s): "FREET3" No results for input(s): "VITAMINB12", "FOLATE", "FERRITIN", "TIBC", "IRON", "RETICCTPCT" in the last 72 hours. Coags: Recent Labs    11/26/23 1251  INR 1.1   Microbiology: No results found for this or any previous visit (from the past 240 hours).  FURTHER DISCHARGE INSTRUCTIONS:  Get Medicines reviewed and adjusted: Please take all your medications with you for your next visit with your Primary MD  Laboratory/radiological data: Please request your Primary MD to go over all hospital tests and procedure/radiological results at the follow up, please ask your Primary MD to get all Hospital records sent to his/her office.  In some cases, they will be blood work, cultures and biopsy results pending at the time of your discharge. Please request that your primary care M.D. goes through all the records of your hospital data and follows up on these results.  Also Note the following: If you experience worsening of your admission symptoms, develop shortness of breath, life threatening emergency, suicidal or homicidal thoughts you must seek medical attention immediately by calling 911 or calling your MD immediately  if symptoms less severe.  You must read complete instructions/literature along with all the possible adverse reactions/side effects for all the Medicines you take and that have been prescribed to you. Take any new Medicines after you have completely understood and accpet all the possible adverse reactions/side effects.   Do not drive when taking Pain medications or sleeping medications (Benzodaizepines)  Do not take more than prescribed Pain, Sleep and Anxiety Medications. It is not advisable to combine  anxiety,sleep and pain medications without talking with your primary care practitioner  Special Instructions: If you have smoked or chewed Tobacco  in the last 2 yrs please stop smoking, stop any regular Alcohol  and or any Recreational drug use.  Wear Seat belts while driving.  Please note: You were cared for by a hospitalist during your hospital stay. Once you are discharged, your primary care physician will handle any further medical issues. Please note that NO REFILLS for any discharge medications will be authorized once you are discharged, as it is imperative that you return to your primary care physician (or establish a relationship with a primary care physician if you do not have one) for your post hospital discharge needs so that they can reassess  your need for medications and monitor your lab values.  Total Time spent coordinating discharge including counseling, education and face to face time equals greater than 30 minutes.  Signed: Clarnce Homan 11/28/2023 10:06 AM

## 2023-11-28 NOTE — Plan of Care (Signed)
  Problem: Education: Goal: Knowledge of disease or condition will improve Outcome: Progressing   Problem: Coping: Goal: Will verbalize positive feelings about self Outcome: Progressing   Problem: Health Behavior/Discharge Planning: Goal: Ability to manage health-related needs will improve Outcome: Progressing   Problem: Clinical Measurements: Goal: Ability to maintain clinical measurements within normal limits will improve Outcome: Progressing   Problem: Activity: Goal: Risk for activity intolerance will decrease Outcome: Progressing   Problem: Safety: Goal: Ability to remain free from injury will improve Outcome: Progressing

## 2023-11-28 NOTE — TOC Transition Note (Signed)
Transition of Care [facility]) - Discharge Note   Patient Details  Name: [name] MRN: [id number] Date of Birth: 1945-12-19  Transition of Care [facility]) CM/SW Contact:  Mearl Latin, LCSW Phone Number: 11/28/2023, 4:31 PM   Clinical Narrative:    Patient will DC to: [facility] Anticipated DC date: 11/28/23 Family notified: Son, Arlys John Transport by: Sharin Mons 5pm after IV abx   Per MD patient ready for DC to [facility]. RN to call report prior to discharge [phone] room 604B). RN, patient, patient's family, and facility notified of DC. Discharge Summary and FL2 sent to facility. DC packet on chart including signed DNR. Ambulance transport requested for patient.   CSW will sign off for now as social work intervention is no longer needed. Please consult [place] again if new needs arise.     Final next level of care: Skilled Nursing Facility Barriers to Discharge: Barriers Resolved   Patient Goals and CMS Choice Patient states their goals for this hospitalization and ongoing recovery are:: Return to ALF CMS Medicare.gov Compare Post Acute Care list provided to:: Patient Represenative (must comment) Choice offered to / list presented to : Adult Children [place] ownership interest in [facility].provided to:: Adult Children    Discharge Placement   Existing PASRR number confirmed : 11/28/23          Patient chooses bed at: [facility] Patient to be transferred to facility by: [facility] Name of family member notified: Arlys John Patient and family notified of of transfer: 11/28/23  Discharge Plan and Services Additional resources added to the After Visit Summary for   In-house Referral: Clinical Social Work   Post Acute Care Choice: NA                               Social Drivers of Health (SDOH) Interventions SDOH Screenings   Food Insecurity: No Food Insecurity (11/28/2023)  Housing: Unknown (11/28/2023)  Transportation Needs: No Transportation Needs  (11/28/2023)  Utilities: Not At Risk (11/28/2023)  Tobacco Use: Low Risk  (11/26/2023)     Readmission Risk Interventions     No data to display

## 2023-11-28 NOTE — Progress Notes (Signed)
STROKE TEAM PROGRESS NOTE   INTERIM HISTORY/SUBJECTIVE [name] is at the bedside. Pt lying in bed, awake alert, more interactive than yesterday, stroke work up complete, on DAPT and statin.   OBJECTIVE  CBC    Component Value Date/Time   WBC 9.3 11/27/2023 0913   RBC 3.70 (L) 11/27/2023 0913   HGB 11.5 (L) 11/27/2023 0913   HCT 34.3 (L) 11/27/2023 0913   PLT 192 11/27/2023 0913   MCV 92.7 11/27/2023 0913   MCH 31.1 11/27/2023 0913   MCHC 33.5 11/27/2023 0913   RDW 13.5 11/27/2023 0913   LYMPHSABS 0.9 11/26/2023 1251   MONOABS 0.8 11/26/2023 1251   EOSABS 0.0 11/26/2023 1251   BASOSABS 0.0 11/26/2023 1251    BMET    Component Value Date/Time   NA 137 11/27/2023 0913   K 3.7 11/27/2023 0913   CL 105 11/27/2023 0913   CO2 23 11/27/2023 0913   GLUCOSE 101 (H) 11/27/2023 0913   BUN 10 11/27/2023 0913   CREATININE 0.63 11/27/2023 0913   CALCIUM 8.4 (L) 11/27/2023 0913   GFRNONAA >60 11/27/2023 0913    IMAGING past 24 hours CT ANGIO HEAD NECK W WO CM Result Date: 11/27/2023 CLINICAL DATA:  Stroke/TIA EXAM: CT ANGIOGRAPHY HEAD AND NECK WITH AND WITHOUT CONTRAST TECHNIQUE: Multidetector CT imaging of the head and neck was performed using the standard protocol during bolus administration of intravenous contrast. Multiplanar CT image reconstructions and MIPs were obtained to evaluate the vascular anatomy. Carotid stenosis measurements (when applicable) are obtained utilizing NASCET criteria, using the distal internal carotid diameter as the denominator. RADIATION DOSE REDUCTION: This exam was performed according to the departmental dose-optimization program which includes automated exposure control, adjustment of the mA and/or kV according to patient size and/or use of iterative reconstruction technique. CONTRAST:  75mL OMNIPAQUE IOHEXOL 350 MG/ML SOLN COMPARISON:  None Available. FINDINGS: CTA NECK FINDINGS Skeleton: No acute abnormality or high grade bony spinal canal stenosis. Other  neck: Normal pharynx, larynx and major salivary glands. No cervical lymphadenopathy. Unremarkable thyroid gland. Upper chest: No pneumothorax or pleural effusion. No nodules or masses. Aortic arch: There is calcific atherosclerosis of the aortic arch. Conventional 3 vessel aortic branching pattern. RIGHT carotid system: Normal without aneurysm, dissection or stenosis. LEFT carotid system: No dissection, occlusion or aneurysm. Mild atherosclerotic calcification at the carotid bifurcation without hemodynamically significant stenosis. Vertebral arteries: Left dominant configuration. There is no dissection, occlusion or flow-limiting stenosis to the skull base (V1-V3 segments). CTA HEAD FINDINGS POSTERIOR CIRCULATION: Vertebral arteries are normal. No proximal occlusion of the anterior or inferior cerebellar arteries. Basilar artery is normal. Superior cerebellar arteries are normal. Posterior cerebral arteries are normal. ANTERIOR CIRCULATION: Atherosclerotic calcification of the internal carotid arteries at the skull base without hemodynamically significant stenosis. Anterior cerebral arteries are normal. Middle cerebral arteries are normal. Venous sinuses: As permitted by contrast timing, patent. Anatomic variants: None Review of the MIP images confirms the above findings. IMPRESSION: 1. No emergent large vessel occlusion or hemodynamically significant stenosis of the head or neck. Aortic Atherosclerosis (ICD10-I70.0). Electronically Signed   By: [name] M.D.   On: 11/27/2023 23:04   EEG adult Result Date: 11/27/2023 Natalie Fuel, MD     11/27/2023  8:55 PM Routine EEG Report [name] is a 78 y.o. female with a history of altered mental status who is undergoing an EEG to evaluate for seizures. Report: This EEG was acquired with electrodes placed according to the International 10-20 electrode system (including Fp1, Fp2,  F3, F4, C3, C4, P3, P4, O1, O2, T3, T4, T5, T6, A1, A2, Fz, Cz, Pz). The  following electrodes were missing or displaced: none. The occipital dominant rhythm was 4-6 Hz. This activity is reactive to stimulation. Drowsiness was manifested by background fragmentation; deeper stages of sleep were not identified. There was no focal slowing. There were no interictal epileptiform discharges. There were no electrographic seizures identified. Photic stimulation and hyperventilation were not performed. Impression and clinical correlation: This EEG was obtained while awake and drowsy and is abnormal due to moderate-to-severe diffuse slowing indicative of global cerebral dysfunction. Epileptiform abnormalities were not seen during this recording. Natalie Neighbors, MD [facility] [phone] If 7pm- 7am, please page neurology on call as listed in AMION.   MR BRAIN WO CONTRAST Result Date: 11/27/2023 CLINICAL DATA:  Stroke.  Follow-up. EXAM: MRI HEAD WITHOUT CONTRAST TECHNIQUE: Multiplanar, multiecho pulse sequences of the brain and surrounding structures were obtained without intravenous contrast. COMPARISON:  CT yesterday. FINDINGS: Brain: Diffusion imaging does not show any acute or subacute infarction or other cause of restricted diffusion. No focal abnormality affects the brainstem or cerebellum. Cerebral hemispheres show generalized atrophy without subjective lobar predominance. There are minimal chronic small-vessel ischemic changes of the white matter. Slight enlargement of the lateral and third ventricles since the study of 2021. This could be due to progressive brain atrophy, but the possibility of normal pressure hydrocephalus does exist. Vascular: Major vessels at the base of the brain show flow. Skull and upper cervical spine: Negative Sinuses/Orbits: Clear/normal Other: None IMPRESSION: 1. No acute finding by MRI. Generalized atrophy without subjective lobar predominance. Minimal chronic small-vessel ischemic changes of the white matter. 2. Slight enlargement of the lateral  and third ventricles since the study of 2021. This could be due to progressive brain atrophy, but the possibility of normal pressure hydrocephalus does exist. Electronically Signed   By: [name] M.D.   On: 11/27/2023 13:57    Vitals:   11/27/23 1916 11/27/23 2314 11/28/23 0344 11/28/23 0800  BP: (!) 120/56 (!) 169/93 135/77 (!) 140/71  Pulse: 74 93 70 82  Resp: 20 20 20  (!) 22  Temp: 98.4 F (36.9 C) 98 F (36.7 C) 98 F (36.7 C) 98.2 F (36.8 C)  TempSrc: Oral Oral Axillary Oral  SpO2: 94% 94% 96% 93%  Weight:      Height:         PHYSICAL EXAM General:  Alert, thin appearing, elderly patient in no acute distress Psych:  Mood and affect appropriate for situation CV: Regular rate and rhythm on monitor Respiratory:  Regular, unlabored respirations on room air GI: Abdomen soft and nontender   NEURO:  Pt awake, alert, eyes open, orientated to people but not able to tell me the place or time or age. Able to follow simple commands but needed repetitive requests, limited language output, able to name 2/3 simple common subjects, able to repeat simple sentence with prompt. Significant essential tremor with tremulous voice. No gaze palsy, tracking bilaterally, blinking to visual threat bilaterally. No significant facial droop. Tongue midline. Bilateral UEs 4/5, no drift. Bilaterally LEs 3/5 proximal and 4/5 distally. Sensation not cooperative due to cognitive impairment. FTN exam grossly intact if count for significant essential tremor bilaterally, gait not tested.    ASSESSMENT/PLAN Ms. [name] is a 78 y.o. female with history of hypertension, osteoporosis, skin cancer, dementia and essential tremor presenting with acute onset right-sided weakness and right-sided facial droop.  TNK was not administered as symptoms  were too mild to treat.  MRI was negative for acute infarct but did demonstrate slight enlargement of lateral and third ventricles, likely due to atrophy.  Urinalysis  was positive for UTI.  Left-sided TIA vs. Encephalopathy with dementia and ? UTI Code Stroke CT head No acute abnormality. Small vessel disease. Atrophy. ASPECTS 10.    CTA head & neck unremarkable  MRI no acute abnormality, atrophy, chronic small vessel ischemic changes and slight enlargement of lateral and third ventricles with wide callosal angle, likely due to atrophy 2D Echo EF 65 to 70%, normal atrial septum LDL 53 HgbA1c 5.7 UDS neg EEG moderate-to-severe diffuse slowing indicative of global cerebral dysfunction, no seizure VTE prophylaxis -SCDs No antithrombotic prior to admission, now on aspirin 81 mg daily and clopidogrel 75 mg daily for 3 weeks and then aspirin alone. Therapy recommendations:  SNF Disposition: pending  Hypertension Home meds: Irbesartan 75 mg daily Stable Maintain normotension  Hyperlipidemia Home meds: Pravastatin 40 mg daily, resumed in hospital LDL 53, goal < 70 Continue pravastatin 40 High intensity statin not indicated as LDL below goal Continue statin at discharge  ?UTI UA WBC 11-20 with Nitrate + On rocephin x 3   Other Stroke Risk Factors Advanced age  Other Active Problems Essential tremor   Hospital day # 0  Neurology will sign off. Please call with questions. Pt will follow up with stroke clinic NP at [facility] in about 4 weeks. Thanks for the consult.   Natalie Plan, MD PhD Stroke Neurology 11/28/2023 11:44 AM      To contact Stroke Continuity provider, please refer to [email or web address]. After hours, contact General Neurology

## 2023-11-28 NOTE — Evaluation (Signed)
Occupational Therapy Evaluation Patient Details Name: [name] MRN: [id number] DOB: 02-24-1946 Today's Date: 11/28/2023   History of Present Illness Pt is a 78 y/o F presenting to ED on 1/18 from ALF, found on floor with R weakness after fall. MRI negative for acute findings. PMH includes dementia, fibromyalgia, HTN, osteoporosis, tremor   Clinical Impression   Per son (present in room), pt ind at baseline with mobility, and has assist for ADLs from ALF staff. Pt currently needing set up - mod A for ADLs, CGA for bed mobility,and min A +2 for transfers with 2 person HHA. Pt with some difficulty noted tracking and sustaining gaze to R during visual assessment, however pt with hx of dementia/difficulty following commands for assessment. Pt presenting  with impairments listed below, will follow acutely. Patient will benefit from continued inpatient follow up therapy, <3 hours/day to maximize safety/ind with ADL/functional mobility.       If plan is discharge home, recommend the following: A lot of help with walking and/or transfers;A lot of help with bathing/dressing/bathroom;Assistance with cooking/housework;Direct supervision/assist for medications management;Direct supervision/assist for financial management;Assist for transportation;Help with stairs or ramp for entrance    Functional Status Assessment  Patient has had a recent decline in their functional status and demonstrates the ability to make significant improvements in function in a reasonable and predictable amount of time.  Equipment Recommendations  Other (comment) (defer)    Recommendations for Other Services PT consult     Precautions / Restrictions Precautions Precautions: Fall Restrictions Weight Bearing Restrictions Per Provider Order: No      Mobility Bed Mobility Overal bed mobility: Needs Assistance Bed Mobility: Sit to Supine, Supine to Sit     Supine to sit: Contact guard Sit to supine: Contact guard  assist        Transfers Overall transfer level: Needs assistance Equipment used: 2 person hand held assist, 1 person hand held assist Transfers: Sit to/from Stand Sit to Stand: Min assist, +2 physical assistance                  Balance Overall balance assessment: Needs assistance Sitting-balance support: Feet supported Sitting balance-Leahy Scale: Good     Standing balance support: During functional activity, Reliant on assistive device for balance Standing balance-Leahy Scale: Poor Standing balance comment: reliant on external support                           ADL either performed or assessed with clinical judgement   ADL Overall ADL's : Needs assistance/impaired Eating/Feeding: Set up;Sitting   Grooming: Contact guard assist   Upper Body Bathing: Minimal assistance;Sitting   Lower Body Bathing: Moderate assistance;Sitting/lateral leans   Upper Body Dressing : Minimal assistance;Sitting   Lower Body Dressing: Moderate assistance;Sitting/lateral leans   Toilet Transfer: Minimal assistance;+2 for physical assistance;Ambulation;Regular Toilet   Toileting- Architect and Hygiene: Moderate assistance       Functional mobility during ADLs: Minimal assistance;+2 for physical assistance       Vision   Vision Assessment?: Yes Eye Alignment: Within Functional Limits Ocular Range of Motion: Within Functional Limits Alignment/Gaze Preference: Within Defined Limits Tracking/Visual Pursuits: Decreased smoothness of eye movement to RIGHT superior field Additional Comments: difficult to assess sdue ot cognition, pt noted to have some decr tracking to R superior visual field, unable to sustain gaze to R superior visual field, when asked to identify numbers held up in diffierent areas of visual field, pt giving incorrect  number 3/5 times     Perception Perception: Not tested       Praxis Praxis: Not tested       Pertinent Vitals/Pain        Extremity/Trunk Assessment Upper Extremity Assessment Upper Extremity Assessment: Generalized weakness   Lower Extremity Assessment Lower Extremity Assessment: Generalized weakness   Cervical / Trunk Assessment Cervical / Trunk Assessment: Normal   Communication Communication Communication: No apparent difficulties   Cognition Arousal: Alert Behavior During Therapy: WFL for tasks assessed/performed Overall Cognitive Status: History of cognitive impairments - at baseline                                 General Comments: hx of dementia     General Comments  VSS    Exercises     Shoulder Instructions      Home Living Family/patient expects to be discharged to:: Assisted living                             Home Equipment: Shower seat - built in          Prior Functioning/Environment Prior Level of Function : Independent/Modified Independent;History of Falls (last six months) (2 falls in the last 6 months. Last fall resulted in wrist fracture for 9 weeks.)             Mobility Comments: Pt sons states that previous she was independent with no AD however recently has needed more assistance. ADLs Comments: "stand by assistance for showering" per son mostly due to wrist fx        OT Problem List: Decreased strength;Decreased range of motion;Decreased activity tolerance;Impaired balance (sitting and/or standing);Decreased coordination;Decreased safety awareness      OT Treatment/Interventions: Self-care/ADL training;Therapeutic exercise;Energy conservation;DME and/or AE instruction;Therapeutic activities;Patient/family education;Balance training    OT Goals(Current goals can be found in the care plan section) Acute Rehab OT Goals Patient Stated Goal: none stated OT Goal Formulation: With patient Time For Goal Achievement: 12/12/23 Potential to Achieve Goals: Good ADL Goals Pt Will Perform Upper Body Dressing: with contact guard  assist;sitting Pt Will Perform Lower Body Dressing: with contact guard assist;sit to/from stand;sitting/lateral leans Pt Will Transfer to Toilet: with contact guard assist;ambulating;regular height toilet Pt Will Perform Tub/Shower Transfer: Shower transfer;shower seat;with contact guard assist;ambulating  OT Frequency: Min 1X/week    Co-evaluation PT/OT/SLP Co-Evaluation/Treatment: Yes Reason for Co-Treatment: Complexity of the patient's impairments (multi-system involvement);To address functional/ADL transfers;For patient/therapist safety          AM-PAC OT "6 Clicks" Daily Activity     Outcome Measure Help from another person eating meals?: A Little Help from another person taking care of personal grooming?: A Little Help from another person toileting, which includes using toliet, bedpan, or urinal?: A Lot Help from another person bathing (including washing, rinsing, drying)?: A Lot Help from another person to put on and taking off regular upper body clothing?: A Little Help from another person to put on and taking off regular lower body clothing?: A Lot 6 Click Score: 15   End of Session Equipment Utilized During Treatment: Gait belt Nurse Communication: Mobility status  Activity Tolerance: Patient tolerated treatment well Patient left: in bed;with call bell/phone within reach;with bed alarm set;with family/visitor present  OT Visit Diagnosis: Unsteadiness on feet (R26.81);Other abnormalities of gait and mobility (R26.89);Muscle weakness (generalized) (M62.81);History of falling (Z91.81)  Time: 1610-9604 OT Time Calculation (min): 20 min Charges:  OT General Charges $OT Visit: 1 Visit OT Evaluation $OT Eval Moderate Complexity: 1 Mod  Grady Lucci K, OTD, OTR/L SecureChat Preferred Acute Rehab [phone]   Carver Fila Koonce 11/28/2023, 10:54 AM

## 2023-11-28 NOTE — TOC Initial Note (Addendum)
Transition of Care [facility]) - Initial/Assessment Note    Patient Details  Name: [name] MRN: [id number] Date of Birth: 25-Nov-1945  Transition of Care [facility]) CM/SW Contact:    Mearl Latin, LCSW Phone Number: 11/28/2023, 9:08 AM  Clinical Narrative:                 9am-CSW spoke with receptionist at [facility]) and she transferred CSW to nursing station. CSW left voicemail requesting return call.   9:14 AM-CSW received return call from RN at ALF. She requested CSW send DC Summary to f. [phone]. Patient does not use any DME there.   10:13 AM-CSW left voicemail for patient's son to ask about transportation.   CSW faxed DC Summary to ALF.   10:53 AM-Per therapy, they are recommending SNF. CSW will complete workup. She will require insurance approval.  12:42 PM-CSW met with patient's son and her sister and provided SNF ratings list. CSW obtained SNF preference of [facility] waiting to hear back from all of the facilities on bed availability for today (no beds at [facility], [facility], or [facility]).   1:22 PM-[facility] able to accept patient today. CSW initiated insurance process and received approval, Ref# [id number], effective 11/28/2023-11/30/2023. Son updated at bedside and he requested ptar for transport. CSW assisted him with sending mychart proxy form.    Expected Discharge Plan: Assisted Living Barriers to Discharge:  (waiting on ALF)   Patient Goals and CMS Choice Patient states their goals for this hospitalization and ongoing recovery are:: Return to ALF          Expected Discharge Plan and Services In-house Referral: Clinical Social Work   Post Acute Care Choice: NA Living arrangements for the past 2 months: Assisted Living Facility                                      Prior Living Arrangements/Services Living arrangements for the past 2 months: Assisted Living Facility Lives with:: Facility Resident Patient language and need for interpreter  reviewed:: Yes Do you feel safe going back to the place where you live?: Yes      Need for Family Participation in Patient Care: Yes (Comment) Care giver support system in place?: Yes (comment)   Criminal Activity/Legal Involvement Pertinent to Current Situation/Hospitalization: No - Comment as needed  Activities of Daily Living      Permission Sought/Granted Permission sought to share information with : Facility Medical sales representative, Family Supports Permission granted to share information with : No  Share Information with NAME: Arlys John  Permission granted to share info w AGENCY: ALF  Permission granted to share info w Relationship: Son  Permission granted to share info w Contact Information: [phone]  Emotional Assessment Appearance:: Appears stated age Attitude/Demeanor/Rapport: Unable to Assess Affect (typically observed): Unable to Assess Orientation: : Oriented to Self, Oriented to Place Alcohol / Substance Use: Not Applicable Psych Involvement: No (comment)  Admission diagnosis:  Facial droop [R29.810] Stroke [facility]) [I63.9] Patient Active Problem List   Diagnosis Date Noted   Facial droop 11/26/2023   HTN (hypertension) 11/26/2023   Status post surgery 02/17/2016   Osteoporosis 06/04/2014    Class: History of   PCP:  Rodrigo Ran, MD Pharmacy:   [facility] [phone] - [place], [place] - [address] AT [facility] [address] [place] [address] Phone: [phone] Fax: [phone]     Social  Drivers of Health (SDOH) Social History: SDOH Screenings   Food Insecurity: No Food Insecurity (11/28/2023)  Housing: Unknown (11/28/2023)  Transportation Needs: No Transportation Needs (11/28/2023)  Utilities: Not At Risk (11/28/2023)  Tobacco Use: Low Risk  (11/26/2023)   SDOH Interventions:     Readmission Risk Interventions     No data to display

## 2023-11-28 NOTE — Plan of Care (Signed)
  Problem: Health Behavior/Discharge Planning: Goal: Ability to manage health-related needs will improve Outcome: Progressing   Problem: Clinical Measurements: Goal: Ability to maintain clinical measurements within normal limits will improve Outcome: Progressing   Problem: Activity: Goal: Risk for activity intolerance will decrease Outcome: Progressing   Problem: Safety: Goal: Ability to remain free from injury will improve Outcome: Progressing   

## 2023-11-28 NOTE — NC FL2 (Signed)
[employer] MEDICAID FL2 LEVEL OF CARE FORM     IDENTIFICATION  Patient Name: [name] Birthdate: 18-Apr-1946 Sex: female Admission Date (Current Location): 11/26/2023  [facility] and [place] Number:  Producer, television/film/video and Address:  [facility], [address] [place], [place] [address]      Provider Number: [id number]  Attending Physician Name and Address:  Maretta Bees, MD  Relative Name and Phone Number:       Current Level of Care: Hospital Recommended Level of Care: Skilled Nursing Facility Prior Approval Number:    Date Approved/Denied:   PASRR Number: [id number]  Discharge Plan: SNF    Current Diagnoses: Patient Active Problem List   Diagnosis Date Noted   Facial droop 11/26/2023   HTN (hypertension) 11/26/2023   Status post surgery 02/17/2016   Osteoporosis 06/04/2014    Orientation RESPIRATION BLADDER Height & Weight     Self, Place  Normal Incontinent, External catheter Weight: 135 lb 2.3 oz (61.3 kg) Height:  5' 4.5" (163.8 cm)  BEHAVIORAL SYMPTOMS/MOOD NEUROLOGICAL BOWEL NUTRITION STATUS      Continent Diet (see dc summary)  AMBULATORY STATUS COMMUNICATION OF NEEDS Skin   Limited Assist Verbally Normal                       Personal Care Assistance Level of Assistance  Bathing, Feeding, Dressing Bathing Assistance: Limited assistance Feeding assistance: Limited assistance Dressing Assistance: Limited assistance     Functional Limitations Info             SPECIAL CARE FACTORS FREQUENCY  PT (By licensed PT), OT (By licensed OT)     PT Frequency: 5x/week OT Frequency: 5x/week            Contractures Contractures Info: Not present    Additional Factors Info  Code Status, Allergies Code Status Info: DNR Allergies Info: Amoxicillin, Egg-derived Products           Current Medications (11/28/2023):  This is the current hospital active medication list Current Facility-Administered  Medications  Medication Dose Route Frequency Provider Last Rate Last Admin   acetaminophen (TYLENOL) tablet 650 mg  650 mg Oral Q4H PRN Calvert Cantor, MD       Or   acetaminophen (TYLENOL) 160 MG/5ML solution 650 mg  650 mg Per Tube Q4H PRN Calvert Cantor, MD       Or   acetaminophen (TYLENOL) suppository 650 mg  650 mg Rectal Q4H PRN Calvert Cantor, MD       aspirin EC tablet 81 mg  81 mg Oral Daily Rizwan, Ladell Heads, MD   81 mg at 11/28/23 0831   cefTRIAXone (ROCEPHIN) 1 g in sodium chloride 0.9 % 100 mL IVPB  1 g Intravenous Q24H Maretta Bees, MD   Stopped at 11/27/23 2000   clopidogrel (PLAVIX) tablet 75 mg  75 mg Oral Daily Rizwan, Ladell Heads, MD   75 mg at 11/28/23 0831   enoxaparin (LOVENOX) injection 40 mg  40 mg Subcutaneous Q24H Rizwan, Ladell Heads, MD   40 mg at 11/27/23 1632   galantamine (RAZADYNE ER) 24 hr capsule 16 mg  16 mg Oral q morning Rizwan, Ladell Heads, MD   16 mg at 11/28/23 0831   pravastatin (PRAVACHOL) tablet 40 mg  40 mg Oral q1800 Calvert Cantor, MD   40 mg at 11/27/23 1632   senna-docusate (Senokot-S) tablet 1 tablet  1 tablet Oral QHS PRN Calvert Cantor, MD  sertraline (ZOLOFT) tablet 50 mg  50 mg Oral Daily Calvert Cantor, MD   50 mg at 11/28/23 1610     Discharge Medications: Please see discharge summary for a list of discharge medications.  Relevant Imaging Results:  Relevant Lab Results:   Additional Information SSN: [id number] [place], [place]
